# Patient Record
Sex: Male | Born: 1975 | Race: Black or African American | Hispanic: No | Marital: Married | Smoking: Current every day smoker
Health system: Southern US, Community
[De-identification: ages and names within clinical notes are randomized; demographics above are authoritative.]

## PROBLEM LIST (undated history)

## (undated) DIAGNOSIS — L089 Local infection of the skin and subcutaneous tissue, unspecified: Secondary | ICD-10-CM

## (undated) DIAGNOSIS — Z8614 Personal history of Methicillin resistant Staphylococcus aureus infection: Secondary | ICD-10-CM

## (undated) DIAGNOSIS — C4490 Unspecified malignant neoplasm of skin, unspecified: Secondary | ICD-10-CM

## (undated) DIAGNOSIS — M65062 Abscess of tendon sheath, left lower leg: Secondary | ICD-10-CM

## (undated) DIAGNOSIS — S80852A Superficial foreign body, left lower leg, initial encounter: Secondary | ICD-10-CM

## (undated) HISTORY — PX: HAND HARDWARE REMOVAL: SUR1125

## (undated) HISTORY — DX: Unspecified malignant neoplasm of skin, unspecified: C44.90

---

## 2003-07-29 ENCOUNTER — Emergency Department (HOSPITAL_COMMUNITY): Admission: EM | Admit: 2003-07-29 | Discharge: 2003-07-30 | Payer: Self-pay | Admitting: Emergency Medicine

## 2003-08-08 ENCOUNTER — Emergency Department (HOSPITAL_COMMUNITY): Admission: AD | Admit: 2003-08-08 | Discharge: 2003-08-08 | Payer: Self-pay | Admitting: Family Medicine

## 2004-09-01 ENCOUNTER — Emergency Department (HOSPITAL_COMMUNITY): Admission: EM | Admit: 2004-09-01 | Discharge: 2004-09-01 | Payer: Self-pay | Admitting: Emergency Medicine

## 2004-10-29 ENCOUNTER — Emergency Department (HOSPITAL_COMMUNITY): Admission: EM | Admit: 2004-10-29 | Discharge: 2004-10-29 | Payer: Self-pay | Admitting: Emergency Medicine

## 2005-07-16 ENCOUNTER — Emergency Department (HOSPITAL_COMMUNITY): Admission: EM | Admit: 2005-07-16 | Discharge: 2005-07-16 | Payer: Self-pay | Admitting: Emergency Medicine

## 2006-05-18 ENCOUNTER — Emergency Department (HOSPITAL_COMMUNITY): Admission: EM | Admit: 2006-05-18 | Discharge: 2006-05-18 | Payer: Self-pay | Admitting: Emergency Medicine

## 2007-08-27 ENCOUNTER — Emergency Department (HOSPITAL_COMMUNITY): Admission: EM | Admit: 2007-08-27 | Discharge: 2007-08-28 | Payer: Self-pay | Admitting: Emergency Medicine

## 2007-09-01 ENCOUNTER — Emergency Department (HOSPITAL_COMMUNITY): Admission: EM | Admit: 2007-09-01 | Discharge: 2007-09-01 | Payer: Self-pay | Admitting: Emergency Medicine

## 2008-05-19 ENCOUNTER — Emergency Department (HOSPITAL_COMMUNITY): Admission: EM | Admit: 2008-05-19 | Discharge: 2008-05-20 | Payer: Self-pay | Admitting: Emergency Medicine

## 2008-05-21 ENCOUNTER — Encounter (INDEPENDENT_AMBULATORY_CARE_PROVIDER_SITE_OTHER): Payer: Self-pay | Admitting: Emergency Medicine

## 2008-05-21 ENCOUNTER — Ambulatory Visit: Payer: Self-pay | Admitting: Surgery

## 2008-05-21 ENCOUNTER — Ambulatory Visit: Admission: RE | Admit: 2008-05-21 | Discharge: 2008-05-21 | Payer: Self-pay | Admitting: Emergency Medicine

## 2008-05-24 HISTORY — PX: OPEN REDUCTION INTERNAL FIXATION (ORIF) HAND: SHX5991

## 2009-02-25 ENCOUNTER — Emergency Department (HOSPITAL_COMMUNITY): Admission: EM | Admit: 2009-02-25 | Discharge: 2009-02-25 | Payer: Self-pay | Admitting: Emergency Medicine

## 2009-09-15 ENCOUNTER — Emergency Department (HOSPITAL_COMMUNITY): Admission: EM | Admit: 2009-09-15 | Discharge: 2009-09-15 | Payer: Self-pay | Admitting: Emergency Medicine

## 2009-09-15 ENCOUNTER — Emergency Department (HOSPITAL_COMMUNITY): Admission: EM | Admit: 2009-09-15 | Discharge: 2009-09-15 | Payer: Self-pay | Admitting: Family Medicine

## 2010-06-29 ENCOUNTER — Inpatient Hospital Stay (INDEPENDENT_AMBULATORY_CARE_PROVIDER_SITE_OTHER)
Admission: RE | Admit: 2010-06-29 | Discharge: 2010-06-29 | Disposition: A | Discharge status: Home / Self Care | Payer: BLUE CROSS/BLUE SHIELD | Source: Ambulatory Visit | Attending: Family Medicine | Admitting: Family Medicine

## 2010-06-29 DIAGNOSIS — H01009 Unspecified blepharitis unspecified eye, unspecified eyelid: Secondary | ICD-10-CM

## 2010-08-11 LAB — POCT I-STAT, CHEM 8
BUN: 11 mg/dL (ref 6–23)
Calcium, Ion: 1.18 mmol/L (ref 1.12–1.32)
Chloride: 105 mEq/L (ref 96–112)
Creatinine, Ser: 0.9 mg/dL (ref 0.4–1.5)
Glucose, Bld: 100 mg/dL — ABNORMAL HIGH (ref 70–99)

## 2010-08-27 LAB — DIFFERENTIAL
Basophils Absolute: 0.1 10*3/uL (ref 0.0–0.1)
Basophils Relative: 1 % (ref 0–1)
Eosinophils Absolute: 0.4 10*3/uL (ref 0.0–0.7)
Monocytes Absolute: 0.4 10*3/uL (ref 0.1–1.0)
Monocytes Relative: 6 % (ref 3–12)
Neutrophils Relative %: 50 % (ref 43–77)

## 2010-08-27 LAB — CBC
MCHC: 33.5 g/dL (ref 30.0–36.0)
MCV: 87.8 fL (ref 78.0–100.0)
RBC: 4.85 MIL/uL (ref 4.22–5.81)
RDW: 13 % (ref 11.5–15.5)

## 2010-08-27 LAB — BASIC METABOLIC PANEL
CO2: 25 mEq/L (ref 19–32)
Chloride: 106 mEq/L (ref 96–112)
Creatinine, Ser: 1.08 mg/dL (ref 0.4–1.5)
GFR calc Af Amer: >60 mL/min (ref >60)
Glucose, Bld: 100 mg/dL — ABNORMAL HIGH (ref 70–99)

## 2010-08-27 LAB — POCT CARDIAC MARKERS
Myoglobin, poc: 54.3 ng/mL (ref 12–200)
Troponin i, poc: <0.05 ng/mL (ref 0.00–0.09)

## 2011-02-16 LAB — CBC
RBC: 4.41
WBC: 11.9 — ABNORMAL HIGH

## 2011-02-16 LAB — BASIC METABOLIC PANEL
Calcium: 9.2
Creatinine, Ser: 1.08
GFR calc Af Amer: >60

## 2011-02-16 LAB — DIFFERENTIAL
Basophils Relative: 0
Lymphocytes Relative: 15
Lymphs Abs: 1.7
Monocytes Relative: 5
Neutro Abs: 9.4 — ABNORMAL HIGH
Neutrophils Relative %: 79 — ABNORMAL HIGH

## 2011-07-07 ENCOUNTER — Encounter (HOSPITAL_COMMUNITY): Payer: Self-pay | Admitting: *Deleted

## 2011-07-07 ENCOUNTER — Emergency Department (HOSPITAL_COMMUNITY)
Admission: EM | Admit: 2011-07-07 | Discharge: 2011-07-08 | Disposition: A | Discharge status: Home / Self Care | Payer: BC Managed Care – PPO | Attending: Emergency Medicine | Admitting: Emergency Medicine

## 2011-07-07 DIAGNOSIS — R6883 Chills (without fever): Secondary | ICD-10-CM | POA: Insufficient documentation

## 2011-07-07 DIAGNOSIS — J029 Acute pharyngitis, unspecified: Secondary | ICD-10-CM | POA: Insufficient documentation

## 2011-07-07 DIAGNOSIS — F17200 Nicotine dependence, unspecified, uncomplicated: Secondary | ICD-10-CM | POA: Insufficient documentation

## 2011-07-07 DIAGNOSIS — R220 Localized swelling, mass and lump, head: Secondary | ICD-10-CM | POA: Insufficient documentation

## 2011-07-07 DIAGNOSIS — R599 Enlarged lymph nodes, unspecified: Secondary | ICD-10-CM | POA: Insufficient documentation

## 2011-07-07 NOTE — ED Notes (Signed)
sorethroat since this am

## 2011-07-08 MED ORDER — PENICILLIN G BENZATHINE 1200000 UNIT/2ML IM SUSP
1.2000 10*6.[IU] | INTRAMUSCULAR | Status: AC
  Administered 2011-07-08: 1.2 10*6.[IU] via INTRAMUSCULAR
  Filled 2011-07-08: qty 2

## 2011-07-08 MED ORDER — HYDROCODONE-ACETAMINOPHEN 7.5-500 MG/15ML PO SOLN
10.0000 mL | Freq: Once | ORAL | Status: AC
  Administered 2011-07-08: 10 mL via ORAL
  Filled 2011-07-08: qty 15

## 2011-07-08 MED ORDER — LIDOCAINE VISCOUS 2 % MT SOLN
10.0000 mL | Freq: Once | OROMUCOSAL | Status: AC
  Administered 2011-07-08: 10 mL via OROMUCOSAL
  Filled 2011-07-08: qty 15

## 2011-07-08 MED ORDER — DEXAMETHASONE SODIUM PHOSPHATE 10 MG/ML IJ SOLN
10.0000 mg | Freq: Once | INTRAMUSCULAR | Status: AC
  Administered 2011-07-08: 10 mg via INTRAMUSCULAR
  Filled 2011-07-08: qty 1

## 2011-07-08 MED ORDER — HYDROCODONE-ACETAMINOPHEN 7.5-325 MG/15ML PO SOLN: 15.0000 mL | Freq: Four times a day (QID) | ORAL | Status: AC | PRN

## 2011-07-08 NOTE — Discharge Instructions (Signed)
Please review the instructions below. Do salt water gargles several times a day per the instructions below. He may also find that over-the-counter Chloraseptic Spray as helpful as well for you for her pain. Use liquid medication for pain as needed over the next 1 to 2 days. Rest, drink liquids often and follow up with a primary care physician (PCP) as soon as can be arranged. We have provided a "[facility]" number above, the resource list below and the attached referral to assist you in getting established with a PCP. Return to the emergency department if your symptoms worsen.   Salt Water Gargle  This solution will help make your mouth and throat feel better. HOME CARE INSTRUCTIONS   Mix 1 teaspoon of salt in 8 ounces of warm water.   Gargle with this solution as much or often as you need or as directed. Swish and gargle gently if you have any sores or wounds in your mouth.   Do not swallow this mixture.  Document Released: 02/12/2004 Document Revised: 01/20/2011 Document Reviewed: 07/05/2008 [facility] Patient Information 2012 [place], [place].  Viral and Bacterial Pharyngitis  Pharyngitis is a sore throat. It is an infection of the back of the throat (pharynx). HOME CARE   Only take medicine as told by your doctor. You may get sick again if you do not take medicine as told.   Drink enough fluids to keep your pee (urine) clear or pale yellow.   Rest.   Rinse your mouth (gargle) with salt water ( teaspoon of salt in 8 ounces of water) every 1 to 2 hours. This will help the pain.   For children over the age of 7, suck on hard candy or sore throat lozenges.  GET HELP RIGHT AWAY IF:   There are large, tender lumps in your neck.   You have a rash.   You cough up green, yellow-brown, or bloody mucus.   You have a stiff neck.   There is redness, puffiness (swelling), or very bad pain anywhere on the neck.   You drool or are unable to swallow liquids.   You throw up (vomit) or  are not able to keep medicine or liquids down.   You have very bad pain that will not stop with medicine.   You have problems breathing (not from a stuffy nose).   You cannot open your mouth completely.   You or your child has a temperature by mouth above 102 F (38.9 C), not controlled by medicine.   Your baby is older than 3 months with a rectal temperature of 102 F (38.9 C) or higher.   Your baby is 48 months old or younger with a rectal temperature of 100.4 F (38 C) or higher.  MAKE SURE YOU:   Understand these instructions.   Will watch this condition.   Will get help right away if you or your child is not doing well or gets worse.  Document Released: 10/27/2007 Document Revised: 01/20/2011 Document Reviewed: 06/09/2009 [facility] Patient Information 2012 [place], [place].  RESOURCE GUIDE  Dental Problems  Patients with Medicaid: [facility] [id number] [address]  [address] Phone:  [phone]                                                  Phone:  [phone]  If unable to pay or uninsured, contact:  [facility] or [facility]. to become qualified for the adult dental clinic.  Chronic Pain Problems Contact [facility]  [phone] Patients need to be referred by their primary care doctor.  Insufficient Money for Medicine Contact [employer]:  call "211" or [facility] 340-076-0890.  No Primary Care Doctor Call [facility]  [phone] Other agencies that provide inexpensive medical care    [facility]  5021272032    [facility]  [phone]    [facility]  [phone]    [facility]  [phone]    [facility]  [phone]    [facility]  [phone]  Psychological Services [facility]  [phone] [facility] Services  [phone] [facility] Mental Health   [phone]  (emergency services ([phone])  Substance Abuse Resources [facility]  6840634950 [facility] (979)[phone] [facility] [place] 979-648-0530 Floydene Flock [phone] Residential & Outpatient Substance Abuse Program  [phone]  Abuse/Neglect [facility] Child Abuse Hotline 774-668-4255 [facility] Child Abuse Hotline [phone] (After Hours)  Emergency Shelter [facility] 2071633384  Maternity Homes [facility] [place] [facility] [phone] [facility] [phone]  MRSA Hotline #:   4841012503    [facility] Resources  [facility] [place]     [employer]                          [facility] Dept. [address] [place]                       [address]      [address]  Blondell Reveal Phone:  [phone]                                   Phone:  [phone]                 Phone:  [phone]  [facility] Phone:  [phone]  [facility] Child Abuse Hotline 9860901530 [phone] (After Hours)

## 2011-07-08 NOTE — ED Provider Notes (Signed)
Medical screening examination/treatment/procedure(s) were performed by non-physician practitioner and as supervising physician I was immediately available for consultation/collaboration.   Lyanne Co, MD 07/08/11 [phone]

## 2011-07-08 NOTE — ED Notes (Signed)
Pt c/o sore throat onset this am.  Also c/o right side of neck painful with swelling.

## 2011-07-08 NOTE — ED Provider Notes (Signed)
History     [name]: [id number]  Arrival date & time 07/07/11  2325   First MD Initiated Contact with Patient 07/08/11 0112      Chief Complaint  Patient presents with  . Sore Throat     Patient is a 36 y.o. male presenting with pharyngitis. The history is provided by the patient.  Sore Throat This is a new problem. The current episode started today. The problem occurs constantly. The problem has been gradually worsening. Associated symptoms include chills, a sore throat and swollen glands. Pertinent negatives include no coughing, fever, nausea, rash, urinary symptoms or vomiting.  Patient reports onset of sore throat yesterday morning that gradually worsened throughout the day. States he has felt as if he had a fever but unknown. Denies cough or other associated symptoms.  History reviewed. No pertinent past medical history.  History reviewed. No pertinent past surgical history.  No family history on file.  History  Substance Use Topics  . Smoking status: Current Everyday Smoker  . Smokeless tobacco: Not on file  . Alcohol Use: Yes      Review of Systems  Constitutional: Positive for chills. Negative for fever.  HENT: Positive for sore throat.   Eyes: Negative.   Respiratory: Negative.  Negative for cough.   Cardiovascular: Negative.   Gastrointestinal: Negative.  Negative for nausea and vomiting.  Genitourinary: Negative.   Musculoskeletal: Negative.   Skin: Negative.  Negative for rash.  Neurological: Negative.   Hematological: Negative.   Psychiatric/Behavioral: Negative.     Allergies  Review of patient's allergies indicates no known allergies.  Home Medications  No current outpatient prescriptions on file.  BP 127/80  Pulse 88  Temp(Src) 99 F (37.2 C) (Oral)  Resp 18  SpO2 96%  Physical Exam  Constitutional: He appears well-developed and well-nourished.  HENT:  Head: Normocephalic and atraumatic.  Right Ear: Tympanic membrane, external ear and ear  canal normal.  Left Ear: Tympanic membrane, external ear and ear canal normal.  Nose: Mucosal edema present. No sinus tenderness. Right sinus exhibits no maxillary sinus tenderness and no frontal sinus tenderness. Left sinus exhibits no maxillary sinus tenderness and no frontal sinus tenderness.  Mouth/Throat: Uvula is midline and mucous membranes are normal. Oropharyngeal exudate and posterior oropharyngeal erythema present. No tonsillar abscesses.       Grossly erythematous, enlarged tonsils bil (2-3+) with exudate noted.  Eyes: Conjunctivae are normal.  Neck: Neck supple.  Cardiovascular: Normal rate and regular rhythm.   Pulmonary/Chest: Effort normal and breath sounds normal.  Abdominal: Soft. Bowel sounds are normal.  Musculoskeletal: Normal range of motion.  Lymphadenopathy:    He has cervical adenopathy.       Right cervical: Superficial cervical adenopathy present.  Neurological: He is alert.  Skin: Skin is warm and dry.  Psychiatric: He has a normal mood and affect.    ED Course  Procedures  Findings and clinical impression discussed with patient. Will plan for discharge home with short course of liquid pain medicine, encourage rest and fluids for 24 hours and follow up with primary care physician if not improving. Patient agreeable with plan.    Labs Reviewed  RAPID STREP SCREEN   No results found.   No diagnosis found.    MDM  HPI/PE and clinical findings c/w 1. Acute pharyngitis (exudative)        Leanne Chang, NP 07/08/11 [phone]

## 2011-09-27 ENCOUNTER — Emergency Department (INDEPENDENT_AMBULATORY_CARE_PROVIDER_SITE_OTHER): Payer: BC Managed Care – PPO

## 2011-09-27 ENCOUNTER — Encounter (HOSPITAL_COMMUNITY): Payer: Self-pay | Admitting: *Deleted

## 2011-09-27 ENCOUNTER — Emergency Department (INDEPENDENT_AMBULATORY_CARE_PROVIDER_SITE_OTHER)
Admission: EM | Admit: 2011-09-27 | Discharge: 2011-09-27 | Disposition: A | Discharge status: Home / Self Care | Payer: BC Managed Care – PPO | Source: Home / Self Care | Attending: Emergency Medicine | Admitting: Emergency Medicine

## 2011-09-27 DIAGNOSIS — W3400XA Accidental discharge from unspecified firearms or gun, initial encounter: Secondary | ICD-10-CM

## 2011-09-27 DIAGNOSIS — M7989 Other specified soft tissue disorders: Secondary | ICD-10-CM

## 2011-09-27 DIAGNOSIS — S91009A Unspecified open wound, unspecified ankle, initial encounter: Secondary | ICD-10-CM

## 2011-09-27 DIAGNOSIS — S81839A Puncture wound without foreign body, unspecified lower leg, initial encounter: Secondary | ICD-10-CM

## 2011-09-27 MED ORDER — IBUPROFEN 600 MG PO TABS: 600.0000 mg | ORAL_TABLET | Freq: Four times a day (QID) | ORAL | Status: AC | PRN
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
## 2011-12-06 ENCOUNTER — Emergency Department (HOSPITAL_COMMUNITY)
Admission: EM | Admit: 2011-12-06 | Discharge: 2011-12-06 | Disposition: A | Discharge status: Home Health | Payer: BC Managed Care – PPO | Attending: Emergency Medicine | Admitting: Emergency Medicine

## 2011-12-06 ENCOUNTER — Encounter (HOSPITAL_COMMUNITY): Payer: Self-pay | Admitting: *Deleted

## 2011-12-06 DIAGNOSIS — IMO0002 Reserved for concepts with insufficient information to code with codable children: Secondary | ICD-10-CM

## 2011-12-06 DIAGNOSIS — L03119 Cellulitis of unspecified part of limb: Secondary | ICD-10-CM | POA: Insufficient documentation

## 2011-12-06 DIAGNOSIS — L02419 Cutaneous abscess of limb, unspecified: Secondary | ICD-10-CM | POA: Insufficient documentation

## 2011-12-06 DIAGNOSIS — F17200 Nicotine dependence, unspecified, uncomplicated: Secondary | ICD-10-CM | POA: Insufficient documentation

## 2011-12-06 MED ORDER — SULFAMETHOXAZOLE-TRIMETHOPRIM 800-160 MG PO TABS: 1.0000 | ORAL_TABLET | Freq: Two times a day (BID) | ORAL | Status: AC

## 2011-12-06 MED ORDER — HYDROCODONE-ACETAMINOPHEN 5-325 MG PO TABS: 1.0000 | ORAL_TABLET | ORAL | Status: AC | PRN
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
## 2011-12-06 NOTE — ED Notes (Signed)
Patient with left leg swelling since Friday.  Patient had an abcess that he squeezed today.

## 2011-12-08 NOTE — ED Provider Notes (Signed)
Medical screening examination/treatment/procedure(s) were performed by non-physician practitioner and as supervising physician I was immediately available for consultation/collaboration.  Gerhard Munch, MD 12/08/11 [phone]

## 2012-07-24 ENCOUNTER — Emergency Department (INDEPENDENT_AMBULATORY_CARE_PROVIDER_SITE_OTHER)
Admission: EM | Admit: 2012-07-24 | Discharge: 2012-07-24 | Disposition: A | Discharge status: Home / Self Care | Payer: BC Managed Care – PPO | Source: Home / Self Care

## 2012-07-24 ENCOUNTER — Encounter (HOSPITAL_COMMUNITY): Payer: Self-pay | Admitting: Emergency Medicine

## 2012-07-24 DIAGNOSIS — J111 Influenza due to unidentified influenza virus with other respiratory manifestations: Secondary | ICD-10-CM

## 2012-07-24 MED ORDER — ACETAMINOPHEN 325 MG PO TABS
650.0000 mg | ORAL_TABLET | Freq: Once | ORAL | Status: AC
  Administered 2012-07-24: 650 mg via ORAL

## 2012-07-24 MED ORDER — ACETAMINOPHEN 325 MG PO TABS
ORAL_TABLET | ORAL | Status: AC
  Filled 2012-07-24: qty 2

## 2012-07-24 NOTE — ED Notes (Signed)
Pt is here for cold/flu like sx since this am Sx include: fever, headaches, chills, vomiting (last episode 0900), nauseas, diarrhea, cough w/clear mucous, loss of appetite Last had food yest night; had a little bit of water today Has not had any meds for discomfort BP is 96/58 manual  He is alert w/no signs of acute distress
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
## 2012-07-25 NOTE — ED Provider Notes (Signed)
Medical screening examination/treatment/procedure(s) were performed by resident physician or non-physician practitioner and as supervising physician I was immediately available for consultation/collaboration.   KINDL,JAMES DOUGLAS MD.   James D Kindl, MD 07/25/12 1614 

## 2014-01-20 ENCOUNTER — Emergency Department (HOSPITAL_COMMUNITY)
Admission: EM | Admit: 2014-01-20 | Discharge: 2014-01-20 | Disposition: A | Discharge status: Home / Self Care | Payer: BC Managed Care – PPO | Attending: Emergency Medicine | Admitting: Emergency Medicine

## 2014-01-20 ENCOUNTER — Encounter (HOSPITAL_COMMUNITY): Payer: Self-pay | Admitting: Emergency Medicine

## 2014-01-20 DIAGNOSIS — L03115 Cellulitis of right lower limb: Secondary | ICD-10-CM

## 2014-01-20 DIAGNOSIS — Z87828 Personal history of other (healed) physical injury and trauma: Secondary | ICD-10-CM | POA: Diagnosis not present

## 2014-01-20 DIAGNOSIS — B353 Tinea pedis: Secondary | ICD-10-CM | POA: Diagnosis not present

## 2014-01-20 DIAGNOSIS — F17200 Nicotine dependence, unspecified, uncomplicated: Secondary | ICD-10-CM | POA: Insufficient documentation

## 2014-01-20 DIAGNOSIS — L03119 Cellulitis of unspecified part of limb: Secondary | ICD-10-CM

## 2014-01-20 DIAGNOSIS — L02419 Cutaneous abscess of limb, unspecified: Secondary | ICD-10-CM | POA: Insufficient documentation

## 2014-01-20 DIAGNOSIS — M79609 Pain in unspecified limb: Secondary | ICD-10-CM | POA: Diagnosis present

## 2014-01-20 MED ORDER — DOXYCYCLINE HYCLATE 100 MG PO CAPS: 100.0000 mg | ORAL_CAPSULE | Freq: Two times a day (BID) | ORAL | Status: DC

## 2014-01-20 MED ORDER — NAPROXEN 500 MG PO TABS: 500.0000 mg | ORAL_TABLET | Freq: Two times a day (BID) | ORAL | Status: DC | PRN

## 2014-01-20 MED ORDER — HYDROCODONE-ACETAMINOPHEN 5-325 MG PO TABS: 1.0000 | ORAL_TABLET | Freq: Four times a day (QID) | ORAL | Status: DC | PRN

## 2014-01-20 MED ORDER — NYSTATIN 100000 UNIT/GM EX POWD: CUTANEOUS | Status: DC
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
## 2014-01-20 NOTE — Discharge Instructions (Signed)
Keep feet clean and dry, using nystatin powder 4 times daily and changing your socks with each powder application. After showers, use a hair dryer on cool setting to dry in between toes. Take antibiotic until it is finished. Take naprosyn and norco as directed, as needed for pain but do not drive or operate machinery with norco use. Followup with [facility]Primary Care doctor in 4 days for wound recheck.  Return to emergency department for emergent changing or worsening symptoms.   Athlete's Foot  Athlete's foot is a skin infection caused by a fungus. Athlete's foot is often seen between or under the toes. It can also be seen on the bottom of the foot. Athlete's foot can spread to other people by sharing towels or shower stalls. HOME CARE  Only take medicines as told by your doctor. Do not use steroid creams.  Wash your feet daily. Dry your feet well, especially between the toes.  Change your socks every day. Wear cotton or wool socks.  Change your socks 2 to 3 times a day in hot weather.  Wear sandals or canvas tennis shoes with good airflow.  If you have blisters, soak your feet in a solution as told by your doctor. Do this for 20 to 30 minutes, 2 times a day. Dry your feet well after you soak them.  Do not share towels.  Wear sandals when you use shared locker rooms or showers. GET HELP RIGHT AWAY IF:   You have a fever.  Your foot is puffy (swollen), sore, warm, or red.  You are not getting better after 7 days of treatment.  You still have athlete's foot after 30 days.  You have problems caused by your medicine. MAKE SURE YOU:   Understand these instructions.  Will watch your condition.  Will get help right away if you are not doing well or get worse. Document Released: 10/27/2007 Document Revised: 08/02/2011 Document Reviewed: 02/26/2011 [facility] Patient Information 2015 [place], [place]. This information is not intended to replace advice given to you by your  health care provider. Make sure you discuss any questions you have with your health care provider.  Cellulitis Cellulitis is an infection of the skin and the tissue under the skin. The infected area is usually red and tender. This happens most often in the arms and lower legs. HOME CARE   Take your antibiotic medicine as told. Finish the medicine even if you start to feel better.  Keep the infected arm or leg raised (elevated).  Put a warm cloth on the area up to 4 times per day.  Only take medicines as told by your doctor.  Keep all doctor visits as told. GET HELP IF:  You see red streaks on the skin coming from the infected area.  Your red area gets bigger or turns a dark color.  Your bone or joint under the infected area is painful after the skin heals.  Your infection comes back in the same area or different area.  You have a puffy (swollen) bump in the infected area.  You have new symptoms.  You have a fever. GET HELP RIGHT AWAY IF:   You feel very sleepy.  You throw up (vomit) or have watery poop (diarrhea).  You feel sick and have muscle aches and pains. MAKE SURE YOU:   Understand these instructions.  Will watch your condition.  Will get help right away if you are not doing well or get worse. Document Released: 10/27/2007 Document Revised:  09/24/2013 Document Reviewed: 07/26/2011 [employer] Patient Information 2015 [place], [place]. This information is not intended to replace advice given to you by your health care provider. Make sure you discuss any questions you have with your health care provider.  Emergency Department Resource Guide 1) Find a Doctor and Pay Out of Pocket Although you won't have to find out who is covered by your insurance plan, it is a good idea to ask around and get recommendations. You will then need to call the office and see if the doctor you have chosen will accept you as a new patient and what types of options they offer for patients  who are self-pay. Some doctors offer discounts or will set up payment plans for their patients who do not have insurance, but you will need to ask so you aren't surprised when you get to your appointment.  2) Contact Your Local Health Department Not all health departments have doctors that can see patients for sick visits, but many do, so it is worth a call to see if yours does. If you don't know where your local health department is, you can check in your phone book. The CDC also has a tool to help you locate your state's health department, and many state websites also have listings of all of their local health departments.  3) Find a Walk-in Clinic If your illness is not likely to be very severe or complicated, you may want to try a walk in clinic. These are popping up all over the country in pharmacies, drugstores, and shopping centers. They're usually staffed by nurse practitioners or physician assistants that have been trained to treat common illnesses and complaints. They're usually fairly quick and inexpensive. However, if you have serious medical issues or chronic medical problems, these are probably not your best option.  No Primary Care Doctor: - Call [facility] at  [phone] - they can help you locate a primary care doctor that  accepts your insurance, provides certain services, etc. - Physician Referral Service- [phone]  Chronic Pain Problems: Organization         Address  Phone   Notes  [facility]  [phone] Patients need to be referred by their primary care doctor.   Medication Assistance: Organization         Address  Phone   Notes  [facility] Medication [facility] [address] [place] [place], [place] [address] [phone] --Must be a resident of [facility] -- Must have NO insurance coverage whatsoever (no Medicaid/ Medicare, etc.) -- The pt. MUST have a primary care doctor that directs their care regularly and follows  them in the community   [facility]  [phone]   [employer]  [phone]    Agencies that provide inexpensive medical care: Organization         Address  Phone   Notes  [facility]  [phone]   [facility]    [phone]   [facility] [address] [place], [place] [address] [phone]   [facility] [address] [place] [phone]   [facility]    [phone]   [facility]    [phone]   [facility]  [address], [place] Phone:  [phone], Fax:  [phone] Hours of Operation:  9 am - 6 pm, M-F.  Also accepts Medicaid/Medicare and self-pay.  [facility]  [address], [place] Phone: [phone], Fax: [phone]. Hours of Operation:  8:30 am - 5:30 pm, M-F.  Also accepts Medicaid and self-pay.  [facility] [place] [address], [place] Phone: [phone]   [facility] [address] Natasha Bence [place], [place] [phone], Ext. [phone] Mondays & Thursdays: 7-9 AM.  First 15 patients are seen on a first come, first serve basis.    Medicaid-accepting [facility] Providers:  Organization         Address  Phone   Notes  [facility] [address], [place] [phone] Also accepts self-pay patients.  [facility] [address] Laurell Josephs [place], [place]  [phone]   [facility] [address], [place] [phone]   [facility] [address] [place] [phone]   Renaye Rakers [address], [place]   [phone] Only accepts [place] patients after they have their name applied to their card.   Self-Pay (no insurance) in [facility]:  Organization         Address  Phone   Notes  Sickle Cell  Patients, [facility] [address] [place], [place] [phone]   [facility] [address] [place], [place] [phone]   [facility]  [address], [place] [phone]   [facility]/Dr. Osei-Bonsu  [address], [place] or [address], [place] [phone] Phone number for both [place] and [place] locations is the same.  [facility] [address] [place] [phone]   [facility] [address], [place] or [address] [phone]   [facility] [address], [place] [phone], phone; [phone], fax Sees patients 1st and 3rd Saturday of every month.  Must not qualify for public or private insurance (i.e. Medicaid, Medicare, [place] Choice, Veterans' Benefits)  Household income should be no more than 200% of the poverty level The clinic cannot treat you if you are pregnant or think you are pregnant  Sexually transmitted diseases are not treated at the clinic.

## 2014-01-20 NOTE — ED Provider Notes (Signed)
Medical screening examination/treatment/procedure(s) were performed by non-physician practitioner and as supervising physician I was immediately available for consultation/collaboration.   EKG Interpretation None        Gilda Crease, MD 01/20/14 [phone]

## 2014-01-20 NOTE — ED Notes (Signed)
Patient arrives with complaint of right foot pain. States that it started Friday and it his foot is swollen. Comparison of the feet reveals mild swelling of right foot. Tenderness noted with palpation. Denies injury, fever, rash, and is able to ambulate. Denies history of similar problem and diabetes. No obvious lesions or wounds.

## 2014-01-23 ENCOUNTER — Emergency Department (HOSPITAL_COMMUNITY): Payer: BC Managed Care – PPO

## 2014-01-23 ENCOUNTER — Encounter (HOSPITAL_COMMUNITY): Payer: Self-pay | Admitting: Emergency Medicine

## 2014-01-23 ENCOUNTER — Emergency Department (HOSPITAL_COMMUNITY)
Admission: EM | Admit: 2014-01-23 | Discharge: 2014-01-23 | Disposition: A | Discharge status: Home / Self Care | Payer: BC Managed Care – PPO | Attending: Emergency Medicine | Admitting: Emergency Medicine

## 2014-01-23 DIAGNOSIS — L089 Local infection of the skin and subcutaneous tissue, unspecified: Secondary | ICD-10-CM | POA: Diagnosis present

## 2014-01-23 DIAGNOSIS — L03119 Cellulitis of unspecified part of limb: Secondary | ICD-10-CM | POA: Diagnosis not present

## 2014-01-23 DIAGNOSIS — L02619 Cutaneous abscess of unspecified foot: Secondary | ICD-10-CM | POA: Insufficient documentation

## 2014-01-23 DIAGNOSIS — F17200 Nicotine dependence, unspecified, uncomplicated: Secondary | ICD-10-CM | POA: Diagnosis not present

## 2014-01-23 DIAGNOSIS — Z87828 Personal history of other (healed) physical injury and trauma: Secondary | ICD-10-CM | POA: Diagnosis not present

## 2014-01-23 DIAGNOSIS — L03115 Cellulitis of right lower limb: Secondary | ICD-10-CM

## 2014-01-23 LAB — CBC WITH DIFFERENTIAL/PLATELET
Basophils Absolute: 0 10*3/uL (ref 0.0–0.1)
Basophils Relative: 0 % (ref 0–1)
Eosinophils Absolute: 0.4 10*3/uL (ref 0.0–0.7)
Eosinophils Relative: 4 % (ref 0–5)
HCT: 41.3 % (ref 39.0–52.0)
Hemoglobin: 14.3 g/dL (ref 13.0–17.0)
Lymphocytes Relative: 30 % (ref 12–46)
Lymphs Abs: 3.3 10*3/uL (ref 0.7–4.0)
MCH: 29.8 pg (ref 26.0–34.0)
MCHC: 34.6 g/dL (ref 30.0–36.0)
MCV: 86 fL (ref 78.0–100.0)
Monocytes Absolute: 0.6 10*3/uL (ref 0.1–1.0)
Monocytes Relative: 6 % (ref 3–12)
Neutro Abs: 6.6 10*3/uL (ref 1.7–7.7)
Neutrophils Relative %: 60 % (ref 43–77)
Platelets: 255 10*3/uL (ref 150–400)
RBC: 4.8 MIL/uL (ref 4.22–5.81)
RDW: 13 % (ref 11.5–15.5)
WBC: 10.9 10*3/uL — ABNORMAL HIGH (ref 4.0–10.5)

## 2014-01-23 LAB — SEDIMENTATION RATE: Sed Rate: 3 mm/hr (ref 0–16)

## 2014-01-23 LAB — I-STAT CHEM 8, ED
BUN: 14 mg/dL (ref 6–23)
Calcium, Ion: 1.21 mmol/L (ref 1.12–1.23)
Chloride: 105 mEq/L (ref 96–112)
Creatinine, Ser: 1.2 mg/dL (ref 0.50–1.35)
Glucose, Bld: 115 mg/dL — ABNORMAL HIGH (ref 70–99)
HCT: 48 % (ref 39.0–52.0)
Hemoglobin: 16.3 g/dL (ref 13.0–17.0)
Potassium: 4.1 mEq/L (ref 3.7–5.3)
Sodium: 141 mEq/L (ref 137–147)
TCO2: 27 mmol/L (ref 0–100)

## 2014-01-23 MED ORDER — CEPHALEXIN 500 MG PO CAPS: 500.0000 mg | ORAL_CAPSULE | Freq: Four times a day (QID) | ORAL | Status: AC

## 2014-01-23 MED ORDER — MORPHINE SULFATE 4 MG/ML IJ SOLN
4.0000 mg | Freq: Once | INTRAMUSCULAR | Status: AC
  Administered 2014-01-23: 4 mg via INTRAVENOUS
  Filled 2014-01-23: qty 1

## 2014-01-23 MED ORDER — HYDROCODONE-ACETAMINOPHEN 5-325 MG PO TABS: 2.0000 | ORAL_TABLET | ORAL | Status: DC | PRN

## 2014-01-23 MED ORDER — VANCOMYCIN HCL 10 G IV SOLR
1500.0000 mg | Freq: Once | INTRAVENOUS | Status: AC
  Administered 2014-01-23: 1500 mg via INTRAVENOUS
  Filled 2014-01-23: qty 1500

## 2014-01-23 NOTE — Discharge Instructions (Signed)
Continue taking the Doxycycline and complete this as prescribed. Take the Keflex as prescribed. Return to the ED on Friday between 3 pm and 11pm to have the infection re checked. Ask to be bedded in the pod B where Dr. Malen Gauze is working. If you develop fever, chills, sweats, worsening pain or any other alarming or concerning issues, return to the ED.

## 2014-01-23 NOTE — ED Provider Notes (Signed)
38 year old male with a history of a right fourth toe swelling and pain, on doxycycline for the last 3 days, no improvement. He is gradually worsened, on exam he has redness swelling and tenderness to the right fourth toe, associated drainage between the third and fourth toe, tenderness extends onto the distal foot, no obvious asymmetry of the feet, normal pulses, no tachycardia, no fever. Imaging of the foot to rule out osteomyelitis, start vancomycin here, sent home with cephalosporin in addition to doxycycline. Can follow up with family doctor.  xdray without acute findings, VS without high risk findings, tolerating oral, add keflex.  I saw and evaluated the patient, reviewed the resident's note and I agree with the findings and plan.    Vida Roller, MD 01/24/14 [phone]

## 2014-01-23 NOTE — ED Notes (Signed)
Patient transported to X-ray 
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
## 2014-01-23 NOTE — ED Notes (Signed)
Pt in for follow up on wound infection to his right foot, states the pain and swelling continues, seen for same last week, denies fever at home

## 2014-01-24 LAB — C-REACTIVE PROTEIN: CRP: 0.9 mg/dL — AB (ref <0.60)

## 2014-01-24 NOTE — ED Provider Notes (Signed)
I saw and evaluated the patient, reviewed the resident's note and I agree with the findings and plan.  Please see my separate note regarding my evaluation of the patient.  Clinical Impression:  Infection of toe  Vida Roller, MD 01/24/14 1427

## 2014-04-25 ENCOUNTER — Encounter (HOSPITAL_COMMUNITY): Payer: Self-pay | Admitting: Emergency Medicine

## 2014-04-25 ENCOUNTER — Emergency Department (HOSPITAL_COMMUNITY)
Admission: EM | Admit: 2014-04-25 | Discharge: 2014-04-25 | Disposition: A | Discharge status: Home / Self Care | Payer: BC Managed Care – PPO | Attending: Emergency Medicine | Admitting: Emergency Medicine

## 2014-04-25 DIAGNOSIS — Z720 Tobacco use: Secondary | ICD-10-CM | POA: Diagnosis not present

## 2014-04-25 DIAGNOSIS — J020 Streptococcal pharyngitis: Secondary | ICD-10-CM | POA: Diagnosis not present

## 2014-04-25 DIAGNOSIS — L729 Follicular cyst of the skin and subcutaneous tissue, unspecified: Secondary | ICD-10-CM | POA: Diagnosis present

## 2014-04-25 DIAGNOSIS — Z87828 Personal history of other (healed) physical injury and trauma: Secondary | ICD-10-CM | POA: Insufficient documentation

## 2014-04-25 DIAGNOSIS — N492 Inflammatory disorders of scrotum: Secondary | ICD-10-CM | POA: Diagnosis not present

## 2014-04-25 LAB — RAPID STREP SCREEN (MED CTR MEBANE ONLY): Streptococcus, Group A Screen (Direct): POSITIVE — AB

## 2014-04-25 MED ORDER — ACETAMINOPHEN-CODEINE 120-12 MG/5ML PO SOLN: 10.0000 mL | ORAL | Status: DC | PRN

## 2014-04-25 MED ORDER — CLINDAMYCIN HCL 150 MG PO CAPS: 450.0000 mg | ORAL_CAPSULE | Freq: Three times a day (TID) | ORAL | Status: DC

## 2014-04-25 NOTE — ED Notes (Signed)
Pt reports abscess on scrotum and popped it. Upon assessment with PA site intact with drainage at opening. Abscess localized; no cellulitis noted per PA. Pt reports sore throat starting three days ago.

## 2014-04-25 NOTE — ED Notes (Signed)
Pt c/o abscess on scrotum since Monday. Pt sts he "popped" it yesterday and it has gotten larger and more painful since then. Pt denies odor. Pt c/o some pus discharge. Pt A&Ox4. Ambulatory to triage.
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
## 2014-04-25 NOTE — Discharge Instructions (Signed)
Return here as needed. Follow up with the urologist provided. Increase your fluid intake. Use warm soaks and compresses on your scrotum

## 2014-05-24 DIAGNOSIS — Z8614 Personal history of Methicillin resistant Staphylococcus aureus infection: Secondary | ICD-10-CM

## 2014-05-24 HISTORY — DX: Personal history of Methicillin resistant Staphylococcus aureus infection: Z86.14

## 2014-07-29 ENCOUNTER — Emergency Department (HOSPITAL_COMMUNITY)
Admission: EM | Admit: 2014-07-29 | Discharge: 2014-07-30 | Disposition: A | Discharge status: Home / Self Care | Payer: BLUE CROSS/BLUE SHIELD | Attending: Emergency Medicine | Admitting: Emergency Medicine

## 2014-07-29 ENCOUNTER — Encounter (HOSPITAL_COMMUNITY): Payer: Self-pay | Admitting: Emergency Medicine

## 2014-07-29 DIAGNOSIS — Z87828 Personal history of other (healed) physical injury and trauma: Secondary | ICD-10-CM | POA: Diagnosis not present

## 2014-07-29 DIAGNOSIS — L02415 Cutaneous abscess of right lower limb: Secondary | ICD-10-CM

## 2014-07-29 DIAGNOSIS — L03115 Cellulitis of right lower limb: Secondary | ICD-10-CM | POA: Insufficient documentation

## 2014-07-29 DIAGNOSIS — Z792 Long term (current) use of antibiotics: Secondary | ICD-10-CM | POA: Insufficient documentation

## 2014-07-29 DIAGNOSIS — M65051 Abscess of tendon sheath, right thigh: Secondary | ICD-10-CM | POA: Insufficient documentation

## 2014-07-29 DIAGNOSIS — Z720 Tobacco use: Secondary | ICD-10-CM | POA: Diagnosis not present

## 2014-07-29 NOTE — ED Notes (Signed)
Pt states he has an abscess to his right groin area that started yesterday  Pt states it hurts to walk  Pt has hx of abscesses in the past

## 2014-07-30 MED ORDER — LIDOCAINE-EPINEPHRINE 2 %-1:100000 IJ SOLN
INTRAMUSCULAR | Status: AC
  Filled 2014-07-30: qty 1

## 2014-07-30 MED ORDER — OXYCODONE-ACETAMINOPHEN 5-325 MG PO TABS
2.0000 | ORAL_TABLET | Freq: Once | ORAL | Status: AC
  Administered 2014-07-30: 2 via ORAL
  Filled 2014-07-30: qty 2

## 2014-07-30 MED ORDER — HYDROCODONE-ACETAMINOPHEN 5-325 MG PO TABS: 1.0000 | ORAL_TABLET | Freq: Four times a day (QID) | ORAL | Status: DC | PRN

## 2014-07-30 MED ORDER — CLINDAMYCIN HCL 150 MG PO CAPS: 300.0000 mg | ORAL_CAPSULE | Freq: Three times a day (TID) | ORAL | Status: DC

## 2014-07-30 MED ORDER — LIDOCAINE-EPINEPHRINE (PF) 2 %-1:200000 IJ SOLN
10.0000 mL | Freq: Once | INTRAMUSCULAR | Status: AC
  Administered 2014-07-30: 10 mL

## 2014-07-30 NOTE — Discharge Instructions (Signed)
Recommend warm soaks or compresses 2-3 times per day to promote drainage. Take clindamycin as prescribed. You take Norco as needed for severe pain. Change your dressing at least once per day to keep the area clean and dry.  Abscess Care After An abscess (also called a boil or furuncle) is an infected area that contains a collection of pus. Signs and symptoms of an abscess include pain, tenderness, redness, or hardness, or you may feel a moveable soft area under your skin. An abscess can occur anywhere in the body. The infection may spread to surrounding tissues causing cellulitis. A cut (incision) by the surgeon was made over your abscess and the pus was drained out. Gauze may have been packed into the space to provide a drain that will allow the cavity to heal from the inside outwards. The boil may be painful for 5 to 7 days. Most people with a boil do not have high fevers. Your abscess, if seen early, may not have localized, and may not have been lanced. If not, another appointment may be required for this if it does not get better on its own or with medications. HOME CARE INSTRUCTIONS   Only take over-the-counter or prescription medicines for pain, discomfort, or fever as directed by your caregiver.  When you bathe, soak and then remove gauze or iodoform packs at least daily or as directed by your caregiver. You may then wash the wound gently with mild soapy water. Repack with gauze or do as your caregiver directs. SEEK IMMEDIATE MEDICAL CARE IF:   You develop increased pain, swelling, redness, drainage, or bleeding in the wound site.  You develop signs of generalized infection including muscle aches, chills, fever, or a general ill feeling.  An oral temperature above 102 F (38.9 C) develops, not controlled by medication. See your caregiver for a recheck if you develop any of the symptoms described above. If medications (antibiotics) were prescribed, take them as directed. Document Released:  11/26/2004 Document Revised: 08/02/2011 Document Reviewed: 07/24/2007 [facility] Patient Information 2015 [place], [place]. This information is not intended to replace advice given to you by your health care provider. Make sure you discuss any questions you have with your health care provider.

## 2014-07-30 NOTE — ED Notes (Signed)
Pt ambulating independently w/ steady gait on d/c in no acute distress, A&Ox4. D/c instructions reviewed w/ pt and family - pt and family deny any further questions or concerns at present. Rx given x2  

## 2014-08-01 ENCOUNTER — Emergency Department (HOSPITAL_COMMUNITY)
Admission: EM | Admit: 2014-08-01 | Discharge: 2014-08-02 | Disposition: A | Discharge status: Home / Self Care | Payer: BLUE CROSS/BLUE SHIELD | Attending: Emergency Medicine | Admitting: Emergency Medicine

## 2014-08-01 ENCOUNTER — Encounter (HOSPITAL_COMMUNITY): Payer: Self-pay | Admitting: Emergency Medicine

## 2014-08-01 DIAGNOSIS — Z720 Tobacco use: Secondary | ICD-10-CM | POA: Insufficient documentation

## 2014-08-01 DIAGNOSIS — Z4801 Encounter for change or removal of surgical wound dressing: Secondary | ICD-10-CM | POA: Diagnosis not present

## 2014-08-01 DIAGNOSIS — Z09 Encounter for follow-up examination after completed treatment for conditions other than malignant neoplasm: Secondary | ICD-10-CM

## 2014-08-01 DIAGNOSIS — Z87828 Personal history of other (healed) physical injury and trauma: Secondary | ICD-10-CM | POA: Insufficient documentation

## 2014-08-01 DIAGNOSIS — Z792 Long term (current) use of antibiotics: Secondary | ICD-10-CM | POA: Insufficient documentation

## 2014-08-01 NOTE — ED Notes (Signed)
Pt states he was seen here Monday night for an abscess on his right inner thigh and had an I&D  Pt states he was told to come in today for recheck  Pt states the area is not getting any better and he is now getting one on his left inner thigh as well  Pt states he was given antibiotics that he is currently still taking
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
## 2014-08-02 MED ORDER — HYDROCODONE-ACETAMINOPHEN 5-325 MG PO TABS: 1.0000 | ORAL_TABLET | ORAL | Status: DC | PRN

## 2014-08-02 NOTE — Discharge Instructions (Signed)
Heat Therapy °Heat therapy can help ease sore, stiff, injured, and tight muscles and joints. Heat relaxes your muscles, which may help ease your pain.  °RISKS AND COMPLICATIONS °If you have any of the following conditions, do not use heat therapy unless your health care provider has approved: °· Poor circulation. °· Healing wounds or scarred skin in the area being treated. °· Diabetes, heart disease, or high blood pressure. °· Not being able to feel (numbness) the area being treated. °· Unusual swelling of the area being treated. °· Active infections. °· Blood clots. °· Cancer. °· Inability to communicate pain. This may include young children and people who have problems with their brain function (dementia). °· Pregnancy. °Heat therapy should only be used on old, pre-existing, or long-lasting (chronic) injuries. Do not use heat therapy on new injuries unless directed by your health care provider. °HOW TO USE HEAT THERAPY °There are several different kinds of heat therapy, including: °· Moist heat pack. °· Warm water bath. °· Hot water bottle. °· Electric heating pad. °· Heated gel pack. °· Heated wrap. °· Electric heating pad. °Use the heat therapy method suggested by your health care provider. Follow your health care provider's instructions on when and how to use heat therapy. °GENERAL HEAT THERAPY RECOMMENDATIONS °· Do not sleep while using heat therapy. Only use heat therapy while you are awake. °· Your skin may turn pink while using heat therapy. Do not use heat therapy if your skin turns red. °· Do not use heat therapy if you have new pain. °· High heat or long exposure to heat can cause burns. Be careful when using heat therapy to avoid burning your skin. °· Do not use heat therapy on areas of your skin that are already irritated, such as with a rash or sunburn. °SEEK MEDICAL CARE IF: °· You have blisters, redness, swelling, or numbness. °· You have new pain. °· Your pain is worse. °MAKE SURE  YOU: °· Understand these instructions. °· Will watch your condition. °· Will get help right away if you are not doing well or get worse. °Document Released: 08/02/2011 Document Revised: 09/24/2013 Document Reviewed: 07/03/2013 °ExitCare® Patient Information ©2015 [employer], LLC. This information is not intended to replace advice given to you by your health care provider. Make sure you discuss any questions you have with your health care provider. ° °

## 2014-08-04 ENCOUNTER — Telehealth (HOSPITAL_BASED_OUTPATIENT_CLINIC_OR_DEPARTMENT_OTHER): Payer: Self-pay | Admitting: Emergency Medicine

## 2014-08-04 LAB — WOUND CULTURE
GRAM STAIN: NONE SEEN
SPECIAL REQUESTS: NORMAL

## 2014-08-04 NOTE — Telephone Encounter (Signed)
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 Resistant to prescribed Clindamycin.   Chart sent to EDP for review
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
## 2014-08-05 ENCOUNTER — Telehealth (HOSPITAL_COMMUNITY): Payer: Self-pay

## 2014-08-05 NOTE — ED Notes (Signed)
Chart returned from edp office. New orders- stop clindamycin and start bactrim DS 1 po bid x 7 days. Per E. Ahw[place] PA.

## 2014-08-05 NOTE — Progress Notes (Signed)
ED Antimicrobial Stewardship Positive Culture Follow Up   [name] is an 39 y.o. male who [facility] with a chief complaint of  Chief Complaint  Patient presents with  . Wound Check    Recent Results (from the past 720 hour(s))  Wound culture     Status: None   Collection Time: 08/02/14 12:11 AM  Result Value Ref Range Status   Specimen Description ABSCESS  Final   Special Requests Normal  Final   Gram Stain   Final    NO WBC SEEN FEW SQUAMOUS EPITHELIAL CELLS PRESENT FEW GRAM POSITIVE COCCI IN PAIRS IN CLUSTERS Performed at [facility]    Culture   Final    ABUNDANT METHICILLIN RESISTANT STAPHYLOCOCCUS AUREUS Note: RIFAMPIN AND GENTAMICIN SHOULD NOT BE USED AS SINGLE DRUGS FOR TREATMENT OF STAPH INFECTIONS. CRITICAL RESULT CALLED TO, READ BACK BY AND VERIFIED WITH: SHANNON GAMMONS @ 9:20 AM 08/04/14 BY DWEEKS Performed at [facility]    Report Status 08/04/2014 FINAL  Final   Organism ID, Bacteria METHICILLIN RESISTANT STAPHYLOCOCCUS AUREUS  Final      Susceptibility   Methicillin resistant staphylococcus aureus - MIC*    CLINDAMYCIN >=8 RESISTANT Resistant     ERYTHROMYCIN >=8 RESISTANT Resistant     GENTAMICIN <=0.5 SENSITIVE Sensitive     LEVOFLOXACIN 0.25 SENSITIVE Sensitive     OXACILLIN >=4 RESISTANT Resistant     PENICILLIN >=0.5 RESISTANT Resistant     RIFAMPIN <=0.5 SENSITIVE Sensitive     TRIMETH/SULFA <=10 SENSITIVE Sensitive     VANCOMYCIN 1 SENSITIVE Sensitive     TETRACYCLINE <=1 SENSITIVE Sensitive     * ABUNDANT METHICILLIN RESISTANT STAPHYLOCOCCUS AUREUS    [x]  Treated with clindamycin, organism resistant to prescribed antimicrobial []  Patient discharged originally without antimicrobial agent and treatment is now indicated  New antibiotic prescription: Bactrim DS 1 tablet BID x 7 days  ED Provider: Trixie DredgeEmily West, PA-C   [name], [name] 08/05/2014, 9:10 AM Infectious Diseases Pharmacist Phone#  [phone]

## 2014-08-12 ENCOUNTER — Telehealth (HOSPITAL_COMMUNITY): Payer: Self-pay

## 2014-08-12 NOTE — ED Notes (Signed)
Unable to reach by telephone. Letter sent to address on record.  

## 2014-08-15 ENCOUNTER — Telehealth (HOSPITAL_BASED_OUTPATIENT_CLINIC_OR_DEPARTMENT_OTHER): Payer: Self-pay | Admitting: Emergency Medicine

## 2014-08-15 NOTE — Telephone Encounter (Signed)
Post ED Visit - Positive Culture Follow-up: Successful Patient Follow-Up  Culture assessed and recommendations reviewed by: []  Wes Dulaney, Pharm.D., BCPS []  Celedonio MiyamotoJeremy Frens, [address] BCPS []  Georgina PillionElizabeth Martin, Pharm.D., BCPS []  BoswellMinh Pham, [address]Ph[address]D., BCPS, AAHIVP []  Estella HuskMichelle Turner, Pharm.D., BCPS, AAHIVP []  Red ChristiansSamson Lee, Pharm.D. []  Cassie Roseanne RenoStewart, [place]D.  Positive abcess +MRSA culture  []  Patient discharged without antimicrobial prescription and treatment is now indicated [x]  Organism is resistant to prescribed ED discharge antimicrobial []  Patient with positive blood cultures  Changes discussed with ED provider: Trixie DredgeEmily West PA New antibiotic prescription Bactrim DS one tablet bid x 7 days [facility] Contacted patient, 08/15/14 1812   Berle MullMiller, Obdulio Mash 08/15/2014, 6:12 PM

## 2014-10-13 ENCOUNTER — Emergency Department (INDEPENDENT_AMBULATORY_CARE_PROVIDER_SITE_OTHER)
Admission: EM | Admit: 2014-10-13 | Discharge: 2014-10-13 | Disposition: A | Discharge status: Home / Self Care | Payer: BLUE CROSS/BLUE SHIELD | Source: Home / Self Care

## 2014-10-13 ENCOUNTER — Encounter (HOSPITAL_COMMUNITY): Payer: Self-pay | Admitting: Emergency Medicine

## 2014-10-13 DIAGNOSIS — K5900 Constipation, unspecified: Secondary | ICD-10-CM

## 2014-10-13 DIAGNOSIS — K625 Hemorrhage of anus and rectum: Secondary | ICD-10-CM

## 2014-10-13 NOTE — Discharge Instructions (Signed)
Bloody Stools Bloody stools often mean that there is a problem in the digestive tract. Your caregiver may use the term "melena" to describe black, tarry, and bad smelling stools or "hematochezia" to describe red or maroon-colored stools. Blood seen in the stool can be caused by bleeding anywhere along the intestinal tract.  A black stool usually means that blood is coming from the upper part of the gastrointestinal tract (esophagus, stomach, or small bowel). Passing maroon-colored stools or bright red blood usually means that blood is coming from lower down in the large bowel or the rectum. However, sometimes massive bleeding in the stomach or small intestine can cause bright red bloody stools.  Consuming black licorice, lead, iron pills, medicines containing bismuth subsalicylate, or blueberries can also cause black stools. Your caregiver can test black stools to see if blood is present. It is important that the cause of the bleeding be found. Treatment can then be started, and the problem can be corrected. Rectal bleeding may not be serious, but you should not assume everything is okay until you know the cause.It is very important to follow up with your caregiver or a specialist in gastrointestinal problems. CAUSES  Blood in the stools can come from various underlying causes.Often, the cause is not found during your first visit. Testing is often needed to discover the cause of bleeding in the gastrointestinal tract. Causes range from simple to serious or even life-threatening.Possible causes include:  Hemorrhoids.These are veins that are full of blood (engorged) in the rectum. They cause pain, inflammation, and may bleed.  Anal fissures.These are areas of painful tearing which may bleed. They are often caused by passing hard stool.  Diverticulosis.These are pouches that form on the colon over time, with age, and may bleed significantly.  Diverticulitis.This is inflammation in areas with  diverticulosis. It can cause pain, fever, and bloody stools, although bleeding is rare.  Proctitis and colitis. These are inflamed areas of the rectum or colon. They may cause pain, fever, and bloody stools.  Polyps and cancer. Colon cancer is a leading cause of preventable cancer death.It often starts out as precancerous polyps that can be removed during a colonoscopy, preventing progression into cancer. Sometimes, polyps and cancer may cause rectal bleeding.  Gastritis and ulcers.Bleeding from the upper gastrointestinal tract (near the stomach) may travel through the intestines and produce black, sometimes tarry, often bad smelling stools. In certain cases, if the bleeding is fast enough, the stools may not be black, but red and the condition may be life-threatening. SYMPTOMS  You may have stools that are bright red and bloody, that are normal color with blood on them, or that are dark black and tarry. In some cases, you may only have blood in the toilet bowl. Any of these cases need medical care. You may also have:  Pain at the anus or anywhere in the rectum.  Lightheadedness or feeling faint.  Extreme weakness.  Nausea or vomiting.  Fever. DIAGNOSIS Your caregiver may use the following methods to find the cause of your bleeding:  Taking a medical history. Age is important. Older people tend to develop polyps and cancer more often. If there is anal pain and a hard, large stool associated with bleeding, a tear of the anus may be the cause. If blood drips into the toilet after a bowel movement, bleeding hemorrhoids may be the problem. The color and frequency of the bleeding are additional considerations. In most cases, the medical history provides clues, but seldom the final  answer.  A visual and finger (digital) exam. Your caregiver will inspect the anal area, looking for tears and hemorrhoids. A finger exam can provide information when there is tenderness or a growth inside. In men, the  prostate is also examined.  Endoscopy. Several types of small, long scopes (endoscopes) are used to view the colon.  In the office, your caregiver may use a rigid, or more commonly, a flexible viewing sigmoidoscope. This exam is called flexible sigmoidoscopy. It is performed in 5 to 10 minutes.  A more thorough exam is accomplished with a colonoscope. It allows your caregiver to view the entire 5 to 6 foot long colon. Medicine to help you relax (sedative) is usually given for this exam. Frequently, a bleeding lesion may be present beyond the reach of the sigmoidoscope. So, a colonoscopy may be the best exam to start with. Both exams are usually done on an outpatient basis. This means the patient does not stay overnight in the hospital or surgery center.  An upper endoscopy may be needed to examine your stomach. Sedation is used and a flexible endoscope is put in your mouth, down to your stomach.  A barium enema X-ray. This is an X-ray exam. It uses liquid barium inserted by enema into the rectum. This test alone may not identify an actual bleeding point. X-rays highlight abnormal shadows, such as those made by lumps (tumors), diverticuli, or colitis. TREATMENT  Treatment depends on the cause of your bleeding.   For bleeding from the stomach or colon, the caregiver doing your endoscopy or colonoscopy may be able to stop the bleeding as part of the procedure.  Inflammation or infection of the colon can be treated with medicines.  Many rectal problems can be treated with creams, suppositories, or warm baths.  Surgery is sometimes needed.  Blood transfusions are sometimes needed if you have lost a lot of blood.  For any bleeding problem, let your caregiver know if you take aspirin or other blood thinners regularly. HOME CARE INSTRUCTIONS   Take any medicines exactly as prescribed.  Keep your stools soft by eating a diet high in fiber. Prunes (1 to 3 a day) work well for many people.  Drink  enough water and fluids to keep your urine clear or pale yellow.  Take sitz baths if advised. A sitz bath is when you sit in a bathtub with warm water for 10 to 15 minutes to soak, soothe, and cleanse the rectal area.  If enemas or suppositories are advised, be sure you know how to use them. Tell your caregiver if you have problems with this.  Monitor your bowel movements to look for signs of improvement or worsening. SEEK MEDICAL CARE IF:   You do not improve in the time expected.  Your condition worsens after initial improvement.  You develop any new symptoms. SEEK IMMEDIATE MEDICAL CARE IF:   You develop severe or prolonged rectal bleeding.  You vomit blood.  You feel weak or faint.  You have a fever. MAKE SURE YOU:  Understand these instructions.  Will watch your condition.  Will get help right away if you are not doing well or get worse. Document Released: 04/30/2002 Document Revised: 08/02/2011 Document Reviewed: 09/25/2010 [facility] Patient Information 2015 [place], [place]. This information is not intended to replace advice given to you by your health care provider. Make sure you discuss any questions you have with your health care provider.   Please drink plenty of water, increase fiber in diiet. Marland Kitchen Avoid  NSAIDS (ibuprofen,Aleve, Motrin, Aspirin,etc) as it may aggravate issue. Please use OTC Miralax and stool softener of choice to aid in constipation relief. Do not strain to have BM. I have referred you to [facility] for further evaluation call next week for appt-if you haven't heard from them. Return to UC as needed. Go immediately to Er for further eval if you develop worsening rectal bleeding, pain, N,V,etc.
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
## 2014-10-13 NOTE — ED Notes (Signed)
C/o  Blood in stool x 1 wk.   No otc treatment tried.  Denies any other symptoms.

## 2014-10-23 ENCOUNTER — Encounter: Payer: Self-pay | Admitting: Physician Assistant

## 2014-11-04 ENCOUNTER — Emergency Department (HOSPITAL_COMMUNITY)
Admission: EM | Admit: 2014-11-04 | Discharge: 2014-11-04 | Disposition: A | Discharge status: Home / Self Care | Payer: BLUE CROSS/BLUE SHIELD | Attending: Emergency Medicine | Admitting: Emergency Medicine

## 2014-11-04 ENCOUNTER — Encounter (HOSPITAL_COMMUNITY): Payer: Self-pay | Admitting: Emergency Medicine

## 2014-11-04 DIAGNOSIS — X58XXXA Exposure to other specified factors, initial encounter: Secondary | ICD-10-CM | POA: Diagnosis not present

## 2014-11-04 DIAGNOSIS — Y9311 Activity, swimming: Secondary | ICD-10-CM | POA: Diagnosis not present

## 2014-11-04 DIAGNOSIS — Y9289 Other specified places as the place of occurrence of the external cause: Secondary | ICD-10-CM | POA: Diagnosis not present

## 2014-11-04 DIAGNOSIS — R519 Headache, unspecified: Secondary | ICD-10-CM

## 2014-11-04 DIAGNOSIS — Z720 Tobacco use: Secondary | ICD-10-CM | POA: Insufficient documentation

## 2014-11-04 DIAGNOSIS — Y998 Other external cause status: Secondary | ICD-10-CM | POA: Insufficient documentation

## 2014-11-04 DIAGNOSIS — R51 Headache: Secondary | ICD-10-CM

## 2014-11-04 DIAGNOSIS — S060X0A Concussion without loss of consciousness, initial encounter: Secondary | ICD-10-CM | POA: Diagnosis not present

## 2014-11-04 DIAGNOSIS — S0990XA Unspecified injury of head, initial encounter: Secondary | ICD-10-CM | POA: Diagnosis present

## 2014-11-04 MED ORDER — METOCLOPRAMIDE HCL 5 MG/ML IJ SOLN
10.0000 mg | Freq: Once | INTRAMUSCULAR | Status: AC
  Administered 2014-11-04: 10 mg via INTRAVENOUS
  Filled 2014-11-04: qty 2

## 2014-11-04 MED ORDER — KETOROLAC TROMETHAMINE 30 MG/ML IJ SOLN
30.0000 mg | Freq: Once | INTRAMUSCULAR | Status: AC
  Administered 2014-11-04: 30 mg via INTRAVENOUS
  Filled 2014-11-04: qty 1

## 2014-11-04 MED ORDER — SODIUM CHLORIDE 0.9 % IV BOLUS (SEPSIS)
1000.0000 mL | Freq: Once | INTRAVENOUS | Status: AC
  Administered 2014-11-04: 1000 mL via INTRAVENOUS

## 2014-11-04 MED ORDER — DIPHENHYDRAMINE HCL 50 MG/ML IJ SOLN
25.0000 mg | Freq: Once | INTRAMUSCULAR | Status: AC
  Administered 2014-11-04: 25 mg via INTRAVENOUS
  Filled 2014-11-04: qty 1
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
## 2014-11-04 NOTE — ED Notes (Signed)
Pt c/o headache x 1 day, pt denies vomiting but has be nauseous.

## 2014-11-04 NOTE — Discharge Instructions (Signed)
General Headache Without Cause  A headache is pain or discomfort felt around the head or neck area. The specific cause of a headache may not be found. There are many causes and types of headaches. A few common ones are:  · Tension headaches.  · Migraine headaches.  · Cluster headaches.  · Chronic daily headaches.  HOME CARE INSTRUCTIONS   · Keep all follow-up appointments with your caregiver or any specialist referral.  · Only take over-the-counter or prescription medicines for pain or discomfort as directed by your caregiver.  · Lie down in a dark, quiet room when you have a headache.  · Keep a headache journal to find out what may trigger your migraine headaches. For example, write down:  ¨ What you eat and drink.  ¨ How much sleep you get.  ¨ Any change to your diet or medicines.  · Try massage or other relaxation techniques.  · Put ice packs or heat on the head and neck. Use these 3 to 4 times per day for 15 to 20 minutes each time, or as needed.  · Limit stress.  · Sit up straight, and do not tense your muscles.  · Quit smoking if you smoke.  · Limit alcohol use.  · Decrease the amount of caffeine you drink, or stop drinking caffeine.  · Eat and sleep on a regular schedule.  · Get 7 to 9 hours of sleep, or as recommended by your caregiver.  · Keep lights dim if bright lights bother you and make your headaches worse.  SEEK MEDICAL CARE IF:   · You have problems with the medicines you were prescribed.  · Your medicines are not working.  · You have a change from the usual headache.  · You have nausea or vomiting.  SEEK IMMEDIATE MEDICAL CARE IF:   · Your headache becomes severe.  · You have a fever.  · You have a stiff neck.  · You have loss of vision.  · You have muscular weakness or loss of muscle control.  · You start losing your balance or have trouble walking.  · You feel faint or pass out.  · You have severe symptoms that are different from your first symptoms.  MAKE SURE YOU:   · Understand these  instructions.  · Will watch your condition.  · Will get help right away if you are not doing well or get worse.  Document Released: 05/10/2005 Document Revised: 08/02/2011 Document Reviewed: 05/26/2011  ExitCare® Patient Information ©2015 [employer]. This information is not intended to replace advice given to you by your health care provider. Make sure you discuss any questions you have with your health care provider.    Concussion  A concussion, or closed-head injury, is a brain injury caused by a direct blow to the head or by a quick and sudden movement (jolt) of the head or neck. Concussions are usually not life-threatening. Even so, the effects of a concussion can be serious. If you have had a concussion before, you are more likely to experience concussion-like symptoms after a direct blow to the head.   CAUSES  · Direct blow to the head, such as from running into another player during a soccer game, being hit in a fight, or hitting your head on a hard surface.  · A jolt of the head or neck that causes the brain to move back and forth inside the skull, such as in a car crash.  SIGNS AND SYMPTOMS    The signs of a concussion can be hard to notice. Early on, they may be missed by you, family members, and health care providers. You may look fine but act or feel differently.  Symptoms are usually temporary, but they may last for days, weeks, or even longer. Some symptoms may appear right away while others may not show up for hours or days. Every head injury is different. Symptoms include:  · Mild to moderate headaches that will not go away.  · A feeling of pressure inside your head.  · Having more trouble than usual:  ¨ Learning or remembering things you have heard.  ¨ Answering questions.  ¨ Paying attention or concentrating.  ¨ Organizing daily tasks.  ¨ Making decisions and solving problems.  · Slowness in thinking, acting or reacting, speaking, or reading.  · Getting lost or being easily confused.  · Feeling  tired all the time or lacking energy (fatigued).  · Feeling drowsy.  · Sleep disturbances.  ¨ Sleeping more than usual.  ¨ Sleeping less than usual.  ¨ Trouble falling asleep.  ¨ Trouble sleeping (insomnia).  · Loss of balance or feeling lightheaded or dizzy.  · Nausea or vomiting.  · Numbness or tingling.  · Increased sensitivity to:  ¨ Sounds.  ¨ Lights.  ¨ Distractions.  · Vision problems or eyes that tire easily.  · Diminished sense of taste or smell.  · Ringing in the ears.  · Mood changes such as feeling sad or anxious.  · Becoming easily irritated or angry for little or no reason.  · Lack of motivation.  · Seeing or hearing things other people do not see or hear (hallucinations).  DIAGNOSIS  Your health care provider can usually diagnose a concussion based on a description of your injury and symptoms. He or she will ask whether you passed out (lost consciousness) and whether you are having trouble remembering events that happened right before and during your injury.  Your evaluation might include:  · A brain scan to look for signs of injury to the brain. Even if the test shows no injury, you may still have a concussion.  · Blood tests to be sure other problems are not present.  TREATMENT  · Concussions are usually treated in an emergency department, in urgent care, or at a clinic. You may need to stay in the hospital overnight for further treatment.  · Tell your health care provider if you are taking any medicines, including prescription medicines, over-the-counter medicines, and natural remedies. Some medicines, such as blood thinners (anticoagulants) and aspirin, may increase the chance of complications. Also tell your health care provider whether you have had alcohol or are taking illegal drugs. This information may affect treatment.  · Your health care provider will send you home with important instructions to follow.  · How fast you will recover from a concussion depends on many factors. These factors  include how severe your concussion is, what part of your brain was injured, your age, and how healthy you were before the concussion.  · Most people with mild injuries recover fully. Recovery can take time. In general, recovery is slower in older persons. Also, persons who have had a concussion in the past or have other medical problems may find that it takes longer to recover from their current injury.  HOME CARE INSTRUCTIONS  General Instructions  · Carefully follow the directions your health care provider gave you.  · Only take over-the-counter or prescription medicines for pain,   discomfort, or fever as directed by your health care provider.  · Take only those medicines that your health care provider has approved.  · Do not drink alcohol until your health care provider says you are well enough to do so. Alcohol and certain other drugs may slow your recovery and can put you at risk of further injury.  · If it is harder than usual to remember things, write them down.  · If you are easily distracted, try to do one thing at a time. For example, do not try to watch TV while fixing dinner.  · Talk with family members or close friends when making important decisions.  · Keep all follow-up appointments. Repeated evaluation of your symptoms is recommended for your recovery.  · Watch your symptoms and tell others to do the same. Complications sometimes occur after a concussion. Older adults with a brain injury may have a higher risk of serious complications, such as a blood clot on the brain.  · Tell your teachers, school nurse, school counselor, coach, athletic trainer, or work manager about your injury, symptoms, and restrictions. Tell them about what you can or cannot do. They should watch for:  ¨ Increased problems with attention or concentration.  ¨ Increased difficulty remembering or learning new information.  ¨ Increased time needed to complete tasks or assignments.  ¨ Increased irritability or decreased ability to  cope with stress.  ¨ Increased symptoms.  · Rest. Rest helps the brain to heal. Make sure you:  ¨ Get plenty of sleep at night. Avoid staying up late at night.  ¨ Keep the same bedtime hours on weekends and weekdays.  ¨ Rest during the day. Take daytime naps or rest breaks when you feel tired.  · Limit activities that require a lot of thought or concentration. These include:  ¨ Doing homework or job-related work.  ¨ Watching TV.  ¨ Working on the computer.  · Avoid any situation where there is potential for another head injury (football, hockey, soccer, basketball, martial arts, downhill snow sports and horseback riding). Your condition will get worse every time you experience a concussion. You should avoid these activities until you are evaluated by the appropriate follow-up health care providers.  Returning To Your Regular Activities  You will need to return to your normal activities slowly, not all at once. You must give your body and brain enough time for recovery.  · Do not return to sports or other athletic activities until your health care provider tells you it is safe to do so.  · Ask your health care provider when you can drive, ride a bicycle, or operate heavy machinery. Your ability to react may be slower after a brain injury. Never do these activities if you are dizzy.  · Ask your health care provider about when you can return to work or school.  Preventing Another Concussion  It is very important to avoid another brain injury, especially before you have recovered. In rare cases, another injury can lead to permanent brain damage, brain swelling, or death. The risk of this is greatest during the first 7-10 days after a head injury. Avoid injuries by:  · Wearing a seat belt when riding in a car.  · Drinking alcohol only in moderation.  · Wearing a helmet when biking, skiing, skateboarding, skating, or doing similar activities.  · Avoiding activities that could lead to a second concussion, such as contact  or recreational sports, until your health care provider says it   is okay.  · Taking safety measures in your home.  ¨ Remove clutter and tripping hazards from floors and stairways.  ¨ Use grab bars in bathrooms and handrails by stairs.  ¨ Place non-slip mats on floors and in bathtubs.  ¨ Improve lighting in dim areas.  SEEK MEDICAL CARE IF:  · You have increased problems paying attention or concentrating.  · You have increased difficulty remembering or learning new information.  · You need more time to complete tasks or assignments than before.  · You have increased irritability or decreased ability to cope with stress.  · You have more symptoms than before.  Seek medical care if you have any of the following symptoms for more than 2 weeks after your injury:  · Lasting (chronic) headaches.  · Dizziness or balance problems.  · Nausea.  · Vision problems.  · Increased sensitivity to noise or light.  · Depression or mood swings.  · Anxiety or irritability.  · Memory problems.  · Difficulty concentrating or paying attention.  · Sleep problems.  · Feeling tired all the time.  SEEK IMMEDIATE MEDICAL CARE IF:  · You have severe or worsening headaches. These may be a sign of a blood clot in the brain.  · You have weakness (even if only in one hand, leg, or part of the face).  · You have numbness.  · You have decreased coordination.  · You vomit repeatedly.  · You have increased sleepiness.  · One pupil is larger than the other.  · You have convulsions.  · You have slurred speech.  · You have increased confusion. This may be a sign of a blood clot in the brain.  · You have increased restlessness, agitation, or irritability.  · You are unable to recognize people or places.  · You have neck pain.  · It is difficult to wake you up.  · You have unusual behavior changes.  · You lose consciousness.  MAKE SURE YOU:  · Understand these instructions.  · Will watch your condition.  · Will get help right away if you are not doing well or  get worse.  Document Released: 07/31/2003 Document Revised: 05/15/2013 Document Reviewed: 11/30/2012  ExitCare® Patient Information ©2015 [employer]. This information is not intended to replace advice given to you by your health care provider. Make sure you discuss any questions you have with your health care provider.

## 2014-11-04 NOTE — Progress Notes (Signed)
EDCM spoke to patient and his wife at bedside.  Patient's wife confirms patient has [employer] without a pcp.  EDCM provided patient's wife with list of pcps who accept [employer] insurnace within a 10 mile radius of patient's zip code [address].  Discussed importance and purpose of having a pcp.  Patient's wife thankful for resources.  No further EDCM needs at this time.

## 2014-11-13 ENCOUNTER — Ambulatory Visit: Payer: BLUE CROSS/BLUE SHIELD | Admitting: Physician Assistant

## 2015-01-16 ENCOUNTER — Emergency Department (HOSPITAL_COMMUNITY)
Admission: EM | Admit: 2015-01-16 | Discharge: 2015-01-16 | Disposition: A | Discharge status: Home / Self Care | Payer: BLUE CROSS/BLUE SHIELD | Attending: Emergency Medicine | Admitting: Emergency Medicine

## 2015-01-16 ENCOUNTER — Encounter (HOSPITAL_COMMUNITY): Payer: Self-pay

## 2015-01-16 DIAGNOSIS — M79604 Pain in right leg: Secondary | ICD-10-CM | POA: Diagnosis present

## 2015-01-16 DIAGNOSIS — Z720 Tobacco use: Secondary | ICD-10-CM | POA: Insufficient documentation

## 2015-01-16 DIAGNOSIS — G5711 Meralgia paresthetica, right lower limb: Secondary | ICD-10-CM | POA: Insufficient documentation

## 2015-01-16 DIAGNOSIS — Z87828 Personal history of other (healed) physical injury and trauma: Secondary | ICD-10-CM | POA: Diagnosis not present

## 2015-01-16 MED ORDER — GABAPENTIN 100 MG PO CAPS: 100.0000 mg | ORAL_CAPSULE | Freq: Three times a day (TID) | ORAL | Status: DC

## 2015-01-16 NOTE — ED Notes (Signed)
Pt c/o R upper leg pain x "over a month."  Pain score 7/10.  Pt reports "it's like a shooting pain and then, it feels like a burning.  It gets worse if something rubs against it."  Denies injury.  No warmth, redness, or swelling noted.

## 2015-01-16 NOTE — Discharge Instructions (Signed)
Meralgia paraesthetica is a nerve (neurological) condition that causes pain in the outer thigh. It is caused by compression of a nerve called the lateral cutaneous nerve of the thigh. This nerve supplies feeling (sensation) to the outer thigh. In many cases, the cause is not known. Usually the condition improves with conservative (non-surgical) treatment - such as anti-inflammatories, painkillers or steroid injections.  What is meralgia paraesthetica? Meralgia paraesthetica is a nerve (neurological) condition that causes an area of skin over the upper outer thigh to become painful, numb or tingly.  Meralgia paraesthetica is known as a nerve entrapment syndrome. This means it is a collection of symptoms caused by a trapped or compressed nerve. The trapped nerve in question is called the lateral cutaneous nerve of the thigh (also known as the lateral femoral nerve).  What is the treatment for meralgia paraesthetica? Treatments can be grouped into conservative treatments (which are non-surgical), and surgical treatment (operations). In most cases, only conservative treatments are needed.  Examples of conservative treatments include:  Rest - meralgia paraesthetica is aggravated by standing and walking. Reduction in physical activity may be advised in severe cases. It may even be necessary to rest in bed. Weight loss - if obesity is thought to be the cause. Physical therapies - manipulation, massage and stretching exercises sometimes help. Painkillers (analgesics) - such as paracetamol or codeine. Non-steroidal anti-inflammatory drugs (NSAIDs) - such as ibuprofen, naproxen and diclofenac. Corticosteroid injections - commonly referred to as steroid injections. A steroid and, usually some local anaesthetic, can be injected around the lateral cutaneous nerve to numb it and reduce inflammation. Other medicines - sometimes medications are used that act as nerve painkillers. Some types of antidepressant  medications (tricyclic antidepressants) such as amitriptyline or anticonvulsant drugs can be useful for nerve-related pain (also called neuralgia or neuropathic pain). Examples of these drugs include gabapentin (brand name Neurontin), pregabalin (brand name Lyrica) and carbamazepine (brand name Tegretol). If you have meralgia paraesthetica it is also advisable to avoid tight clothing, such as belts or corsets, that presses on the upper thigh/hip area.  Surgical treatment involves taking the pressure off the nerve (surgical decompression) and releasing any entrapment.  What is the outlook (prognosis) for meralgia paraesthetica? Generally, the prognosis is good. Often, the symptoms of pain and pins and needles resolve with time but sometimes the numbness and altered sensation can remain, long-term. But, if there is a serious underlying cause of the entrapment (rare), the prognosis is less good.
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
## 2015-05-12 ENCOUNTER — Encounter (HOSPITAL_COMMUNITY): Payer: Self-pay | Admitting: Nurse Practitioner

## 2015-05-12 ENCOUNTER — Emergency Department (HOSPITAL_COMMUNITY)
Admission: EM | Admit: 2015-05-12 | Discharge: 2015-05-13 | Disposition: A | Discharge status: Home / Self Care | Payer: BLUE CROSS/BLUE SHIELD | Attending: Emergency Medicine | Admitting: Emergency Medicine

## 2015-05-12 DIAGNOSIS — Z79899 Other long term (current) drug therapy: Secondary | ICD-10-CM | POA: Insufficient documentation

## 2015-05-12 DIAGNOSIS — L02416 Cutaneous abscess of left lower limb: Secondary | ICD-10-CM | POA: Insufficient documentation

## 2015-05-12 DIAGNOSIS — L989 Disorder of the skin and subcutaneous tissue, unspecified: Secondary | ICD-10-CM | POA: Diagnosis present

## 2015-05-12 DIAGNOSIS — Z87828 Personal history of other (healed) physical injury and trauma: Secondary | ICD-10-CM | POA: Insufficient documentation

## 2015-05-12 DIAGNOSIS — F17210 Nicotine dependence, cigarettes, uncomplicated: Secondary | ICD-10-CM | POA: Diagnosis not present

## 2015-05-12 DIAGNOSIS — L0201 Cutaneous abscess of face: Secondary | ICD-10-CM | POA: Insufficient documentation

## 2015-05-12 DIAGNOSIS — L02818 Cutaneous abscess of other sites: Secondary | ICD-10-CM

## 2015-05-12 DIAGNOSIS — Z8614 Personal history of Methicillin resistant Staphylococcus aureus infection: Secondary | ICD-10-CM | POA: Insufficient documentation
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
## 2015-05-13 MED ORDER — TRAMADOL HCL 50 MG PO TABS: 50.0000 mg | ORAL_TABLET | Freq: Four times a day (QID) | ORAL | Status: DC | PRN

## 2015-05-13 MED ORDER — TRAMADOL HCL 50 MG PO TABS
50.0000 mg | ORAL_TABLET | Freq: Once | ORAL | Status: AC
  Administered 2015-05-13: 50 mg via ORAL
  Filled 2015-05-13: qty 1

## 2015-05-13 MED ORDER — SULFAMETHOXAZOLE-TRIMETHOPRIM 800-160 MG PO TABS
2.0000 | ORAL_TABLET | Freq: Once | ORAL | Status: AC
  Administered 2015-05-13: 2 via ORAL
  Filled 2015-05-13: qty 2

## 2015-05-13 MED ORDER — SULFAMETHOXAZOLE-TRIMETHOPRIM 800-160 MG PO TABS: 2.0000 | ORAL_TABLET | Freq: Two times a day (BID) | ORAL | Status: AC

## 2015-05-13 NOTE — Discharge Instructions (Signed)
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
## 2015-05-25 DIAGNOSIS — M65062 Abscess of tendon sheath, left lower leg: Secondary | ICD-10-CM

## 2015-05-25 DIAGNOSIS — S80852A Superficial foreign body, left lower leg, initial encounter: Secondary | ICD-10-CM

## 2015-05-25 HISTORY — DX: Abscess of tendon sheath, left lower leg: M65.062

## 2015-05-25 HISTORY — DX: Superficial foreign body, left lower leg, initial encounter: S80.852A

## 2015-06-05 ENCOUNTER — Other Ambulatory Visit: Payer: Self-pay | Admitting: Orthopedic Surgery

## 2015-06-05 ENCOUNTER — Encounter (HOSPITAL_BASED_OUTPATIENT_CLINIC_OR_DEPARTMENT_OTHER): Payer: Self-pay | Admitting: *Deleted

## 2015-06-06 ENCOUNTER — Ambulatory Visit (HOSPITAL_BASED_OUTPATIENT_CLINIC_OR_DEPARTMENT_OTHER): Payer: BLUE CROSS/BLUE SHIELD | Admitting: Anesthesiology

## 2015-06-06 ENCOUNTER — Encounter (HOSPITAL_BASED_OUTPATIENT_CLINIC_OR_DEPARTMENT_OTHER)
Admission: RE | Disposition: A | Discharge status: Home / Self Care | Payer: Self-pay | Source: Ambulatory Visit | Attending: Orthopedic Surgery

## 2015-06-06 ENCOUNTER — Ambulatory Visit (HOSPITAL_BASED_OUTPATIENT_CLINIC_OR_DEPARTMENT_OTHER)
Admission: RE | Admit: 2015-06-06 | Discharge: 2015-06-06 | Disposition: A | Discharge status: Home / Self Care | Payer: BLUE CROSS/BLUE SHIELD | Source: Ambulatory Visit | Attending: Orthopedic Surgery | Admitting: Orthopedic Surgery

## 2015-06-06 ENCOUNTER — Encounter (HOSPITAL_BASED_OUTPATIENT_CLINIC_OR_DEPARTMENT_OTHER): Payer: Self-pay | Admitting: *Deleted

## 2015-06-06 DIAGNOSIS — S80852A Superficial foreign body, left lower leg, initial encounter: Secondary | ICD-10-CM

## 2015-06-06 DIAGNOSIS — L089 Local infection of the skin and subcutaneous tissue, unspecified: Secondary | ICD-10-CM

## 2015-06-06 DIAGNOSIS — M65062 Abscess of tendon sheath, left lower leg: Secondary | ICD-10-CM | POA: Diagnosis present

## 2015-06-06 DIAGNOSIS — Z8614 Personal history of Methicillin resistant Staphylococcus aureus infection: Secondary | ICD-10-CM | POA: Diagnosis not present

## 2015-06-06 DIAGNOSIS — M795 Residual foreign body in soft tissue: Secondary | ICD-10-CM | POA: Insufficient documentation

## 2015-06-06 DIAGNOSIS — F17210 Nicotine dependence, cigarettes, uncomplicated: Secondary | ICD-10-CM | POA: Diagnosis not present

## 2015-06-06 HISTORY — PX: INCISION AND DRAINAGE ABSCESS: SHX5864

## 2015-06-06 HISTORY — PX: FOREIGN BODY REMOVAL: SHX962

## 2015-06-06 HISTORY — DX: Abscess of tendon sheath, left lower leg: M65.062

## 2015-06-06 HISTORY — DX: Personal history of Methicillin resistant Staphylococcus aureus infection: Z86.14

## 2015-06-06 HISTORY — DX: Local infection of the skin and subcutaneous tissue, unspecified: L08.9

## 2015-06-06 HISTORY — DX: Superficial foreign body, left lower leg, initial encounter: S80.852A

## 2015-06-06 SURGERY — INCISION AND DRAINAGE, ABSCESS
Anesthesia: General | Site: Leg Lower | Laterality: Left

## 2015-06-06 MED ORDER — PHENYLEPHRINE 40 MCG/ML (10ML) SYRINGE FOR IV PUSH (FOR BLOOD PRESSURE SUPPORT)
PREFILLED_SYRINGE | INTRAVENOUS | Status: AC
  Filled 2015-06-06: qty 10

## 2015-06-06 MED ORDER — ONDANSETRON HCL 4 MG/2ML IJ SOLN
INTRAMUSCULAR | Status: DC | PRN
  Administered 2015-06-06: 4 mg via INTRAVENOUS

## 2015-06-06 MED ORDER — VANCOMYCIN HCL 1000 MG IV SOLR
1000.0000 mg | INTRAVENOUS | Status: DC | PRN
  Administered 2015-06-06: 1000 mg via INTRAVENOUS

## 2015-06-06 MED ORDER — DEXAMETHASONE SODIUM PHOSPHATE 4 MG/ML IJ SOLN
INTRAMUSCULAR | Status: DC | PRN
  Administered 2015-06-06: 10 mg via INTRAVENOUS

## 2015-06-06 MED ORDER — PROPOFOL 10 MG/ML IV BOLUS
INTRAVENOUS | Status: DC | PRN
  Administered 2015-06-06: 200 mg via INTRAVENOUS

## 2015-06-06 MED ORDER — SULFAMETHOXAZOLE-TRIMETHOPRIM 800-160 MG PO TABS: 1.0000 | ORAL_TABLET | Freq: Two times a day (BID) | ORAL | Status: DC

## 2015-06-06 MED ORDER — LACTATED RINGERS IV SOLN
INTRAVENOUS | Status: DC
  Administered 2015-06-06 (×2): via INTRAVENOUS

## 2015-06-06 MED ORDER — FENTANYL CITRATE (PF) 100 MCG/2ML IJ SOLN
INTRAMUSCULAR | Status: AC
  Filled 2015-06-06: qty 2

## 2015-06-06 MED ORDER — MIDAZOLAM HCL 2 MG/2ML IJ SOLN
INTRAMUSCULAR | Status: AC
  Filled 2015-06-06: qty 2

## 2015-06-06 MED ORDER — VANCOMYCIN HCL IN DEXTROSE 1-5 GM/200ML-% IV SOLN
INTRAVENOUS | Status: AC
  Filled 2015-06-06: qty 200

## 2015-06-06 MED ORDER — HYDROMORPHONE HCL 1 MG/ML IJ SOLN
0.2500 mg | INTRAMUSCULAR | Status: DC | PRN
  Administered 2015-06-06 (×2): 0.5 mg via INTRAVENOUS

## 2015-06-06 MED ORDER — DEXAMETHASONE SODIUM PHOSPHATE 10 MG/ML IJ SOLN
INTRAMUSCULAR | Status: AC
  Filled 2015-06-06: qty 1

## 2015-06-06 MED ORDER — PROPOFOL 500 MG/50ML IV EMUL
INTRAVENOUS | Status: AC
  Filled 2015-06-06: qty 50

## 2015-06-06 MED ORDER — HYDROMORPHONE HCL 1 MG/ML IJ SOLN
INTRAMUSCULAR | Status: AC
  Filled 2015-06-06: qty 1

## 2015-06-06 MED ORDER — CEFAZOLIN SODIUM-DEXTROSE 2-3 GM-% IV SOLR
INTRAVENOUS | Status: AC
  Filled 2015-06-06: qty 50

## 2015-06-06 MED ORDER — PROMETHAZINE HCL 25 MG/ML IJ SOLN: 6.2500 mg | INTRAMUSCULAR | Status: DC | PRN

## 2015-06-06 MED ORDER — ONDANSETRON HCL 4 MG/2ML IJ SOLN
INTRAMUSCULAR | Status: AC
  Filled 2015-06-06: qty 2

## 2015-06-06 MED ORDER — SUCCINYLCHOLINE CHLORIDE 20 MG/ML IJ SOLN
INTRAMUSCULAR | Status: AC
  Filled 2015-06-06: qty 1

## 2015-06-06 MED ORDER — DOXYCYCLINE HYCLATE 50 MG PO CAPS: 50.0000 mg | ORAL_CAPSULE | Freq: Two times a day (BID) | ORAL | Status: DC

## 2015-06-06 MED ORDER — LIDOCAINE HCL (CARDIAC) 20 MG/ML IV SOLN
INTRAVENOUS | Status: AC
  Filled 2015-06-06: qty 5

## 2015-06-06 MED ORDER — MIDAZOLAM HCL 2 MG/2ML IJ SOLN
1.0000 mg | INTRAMUSCULAR | Status: DC | PRN
  Administered 2015-06-06: 2 mg via INTRAVENOUS

## 2015-06-06 MED ORDER — GLYCOPYRROLATE 0.2 MG/ML IJ SOLN: 0.2000 mg | Freq: Once | INTRAMUSCULAR | Status: DC | PRN

## 2015-06-06 MED ORDER — HYDROCODONE-ACETAMINOPHEN 5-325 MG PO TABS: 1.0000 | ORAL_TABLET | Freq: Four times a day (QID) | ORAL | Status: DC | PRN

## 2015-06-06 MED ORDER — FENTANYL CITRATE (PF) 100 MCG/2ML IJ SOLN
50.0000 ug | INTRAMUSCULAR | Status: DC | PRN
  Administered 2015-06-06: 100 ug via INTRAVENOUS

## 2015-06-06 MED ORDER — SCOPOLAMINE 1 MG/3DAYS TD PT72: 1.0000 | MEDICATED_PATCH | Freq: Once | TRANSDERMAL | Status: DC

## 2015-06-06 MED ORDER — LIDOCAINE HCL (CARDIAC) 20 MG/ML IV SOLN
INTRAVENOUS | Status: DC | PRN
  Administered 2015-06-06: 75 mg via INTRAVENOUS

## 2015-06-06 MED ORDER — ATROPINE SULFATE 0.4 MG/ML IJ SOLN
INTRAMUSCULAR | Status: AC
  Filled 2015-06-06: qty 1

## 2015-06-06 SURGICAL SUPPLY — 66 items
BANDAGE ACE 4X5 VEL STRL LF (GAUZE/BANDAGES/DRESSINGS) ×2 IMPLANT
BANDAGE ACE 6X5 VEL STRL LF (GAUZE/BANDAGES/DRESSINGS) IMPLANT
BANDAGE ESMARK 6X9 LF (GAUZE/BANDAGES/DRESSINGS) ×1 IMPLANT
BLADE SURG 15 STRL LF DISP TIS (BLADE) ×1 IMPLANT
BLADE SURG 15 STRL SS (BLADE) ×2
BNDG CMPR 9X6 STRL LF SNTH (GAUZE/BANDAGES/DRESSINGS) ×1
BNDG COHESIVE 4X5 TAN STRL (GAUZE/BANDAGES/DRESSINGS) ×2 IMPLANT
BNDG ESMARK 6X9 LF (GAUZE/BANDAGES/DRESSINGS) ×2
BRUSH SCRUB EZ PLAIN DRY (MISCELLANEOUS) IMPLANT
CANISTER SUCT 1200ML W/VALVE (MISCELLANEOUS) ×2 IMPLANT
CLSR STERI-STRIP ANTIMIC 1/2X4 (GAUZE/BANDAGES/DRESSINGS) IMPLANT
COVER BACK TABLE 60X90IN (DRAPES) ×2 IMPLANT
CUFF TOURNIQUET SINGLE 18IN (TOURNIQUET CUFF) IMPLANT
CUFF TOURNIQUET SINGLE 34IN LL (TOURNIQUET CUFF) ×1 IMPLANT
DECANTER SPIKE VIAL GLASS SM (MISCELLANEOUS) IMPLANT
DRAPE EXTREMITY T 121X128X90 (DRAPE) ×2 IMPLANT
DRAPE INCISE IOBAN 66X45 STRL (DRAPES) IMPLANT
DRAPE OEC MINIVIEW 54X84 (DRAPES) ×1 IMPLANT
DRAPE U 20/CS (DRAPES) ×2 IMPLANT
DRAPE U-SHAPE 47X51 STRL (DRAPES) ×2 IMPLANT
DRSG MEPILEX BORDER 4X8 (GAUZE/BANDAGES/DRESSINGS) ×1 IMPLANT
DRSG PAD ABDOMINAL 8X10 ST (GAUZE/BANDAGES/DRESSINGS) IMPLANT
DURAPREP 26ML APPLICATOR (WOUND CARE) ×1 IMPLANT
ELECT REM PT RETURN 9FT ADLT (ELECTROSURGICAL) ×2
ELECTRODE REM PT RTRN 9FT ADLT (ELECTROSURGICAL) ×1 IMPLANT
GAUZE SPONGE 4X4 12PLY STRL (GAUZE/BANDAGES/DRESSINGS) ×2 IMPLANT
GLOVE BIO SURGEON STRL SZ8 (GLOVE) ×2 IMPLANT
GLOVE BIO SURGEON STRL SZ8.5 (GLOVE) ×2 IMPLANT
GLOVE BIOGEL PI IND STRL 7.0 (GLOVE) IMPLANT
GLOVE BIOGEL PI IND STRL 8 (GLOVE) ×2 IMPLANT
GLOVE BIOGEL PI INDICATOR 7.0 (GLOVE) ×1
GLOVE BIOGEL PI INDICATOR 8 (GLOVE) ×2
GLOVE ECLIPSE 6.5 STRL STRAW (GLOVE) ×1 IMPLANT
GLOVE ORTHO TXT STRL SZ7.5 (GLOVE) ×2 IMPLANT
GOWN STRL REUS W/ TWL LRG LVL3 (GOWN DISPOSABLE) ×1 IMPLANT
GOWN STRL REUS W/ TWL XL LVL3 (GOWN DISPOSABLE) ×2 IMPLANT
GOWN STRL REUS W/TWL LRG LVL3 (GOWN DISPOSABLE) ×2
GOWN STRL REUS W/TWL XL LVL3 (GOWN DISPOSABLE) ×4
HANDPIECE INTERPULSE COAX TIP (DISPOSABLE)
NDL HYPO 25X1 1.5 SAFETY (NEEDLE) IMPLANT
NEEDLE HYPO 25X1 1.5 SAFETY (NEEDLE) IMPLANT
NS IRRIG 1000ML POUR BTL (IV SOLUTION) ×2 IMPLANT
PACK BASIN DAY SURGERY FS (CUSTOM PROCEDURE TRAY) ×2 IMPLANT
PAD CAST 4YDX4 CTTN HI CHSV (CAST SUPPLIES) ×1 IMPLANT
PADDING CAST COTTON 4X4 STRL (CAST SUPPLIES)
PENCIL BUTTON HOLSTER BLD 10FT (ELECTRODE) ×2 IMPLANT
SET HNDPC FAN SPRY TIP SCT (DISPOSABLE) IMPLANT
SLEEVE SCD COMPRESS KNEE MED (MISCELLANEOUS) ×1 IMPLANT
SPLINT FAST PLASTER 5X30 (CAST SUPPLIES)
SPLINT PLASTER CAST FAST 5X30 (CAST SUPPLIES) IMPLANT
SPONGE LAP 18X18 X RAY DECT (DISPOSABLE) ×1 IMPLANT
SPONGE LAP 4X18 X RAY DECT (DISPOSABLE) ×2 IMPLANT
STAPLER VISISTAT 35W (STAPLE) IMPLANT
SUT ETHILON 3 0 PS 1 (SUTURE) ×1 IMPLANT
SUT ETHILON 4 0 PS 2 18 (SUTURE) IMPLANT
SUT MNCRL AB 4-0 PS2 18 (SUTURE) IMPLANT
SUT VIC AB 0 CT1 27 (SUTURE)
SUT VIC AB 0 CT1 27XBRD ANBCTR (SUTURE) IMPLANT
SUT VIC AB 2-0 SH 18 (SUTURE) IMPLANT
SUT VIC AB 3-0 SH 27 (SUTURE)
SUT VIC AB 3-0 SH 27X BRD (SUTURE) IMPLANT
SUT VICRYL 3-0 CR8 SH (SUTURE) IMPLANT
SUT VICRYL 4-0 PS2 18IN ABS (SUTURE) IMPLANT
SWAB COLLECTION DEVICE MRSA (MISCELLANEOUS) ×1 IMPLANT
SWAB CULTURE ESWAB REG 1ML (MISCELLANEOUS) ×1 IMPLANT
SYR BULB 3OZ (MISCELLANEOUS) ×2 IMPLANT

## 2015-06-06 NOTE — Discharge Instructions (Signed)
Diet: As you were doing prior to hospitalization   Shower:  May shower and ok to let water run over the wounds,NO SOAKING IN TUB.  If the bandage gets wet, change with a clean dry gauze.   Dressing:  You may change your dressing tomorrow with dry gauze.  Change daily after that.   Activity:  Increase activity slowly as tolerated, but follow the weight bearing instructions below.  The rules on driving is that you can not be taking narcotics while you drive, and you must feel in control of the vehicle.    Weight Bearing:   As tolerated.  .    To prevent constipation: you may use a stool softener such as -  Colace (over the counter) 100 mg by mouth twice a day  Drink plenty of fluids (prune juice may be helpful) and high fiber foods Miralax (over the counter) for constipation as needed.    Itching:  If you experience itching with your medications, try taking only a single pain pill, or even half a pain pill at a time.  You may take up to 10 pain pills per day, and you can also use benadryl over the counter for itching or also to help with sleep.   Precautions:  If you experience chest pain or shortness of breath - call 911 immediately for transfer to the hospital emergency department!!  If you develop a fever greater that 101 F, purulent drainage from wound, increased redness or drainage from wound, or calf pain -- Call the office at [phone]                                                Follow- Up Appointment:  Please call for an [place] to [place] [phone]     Post Anesthesia Home Care Instructions  Activity: Get plenty of rest for the remainder of the day. A responsible adult should stay with you for 24 hours following the procedure.  For the next 24 hours, DO NOT: -Drive a car -Advertising copywriterperate machinery -Drink alcoholic beverages -Take any medication unless instructed by your physician -Make any legal decisions or sign important  papers.  Meals: Start with liquid foods such as gelatin or soup. Progress to regular foods as tolerated. Avoid greasy, spicy, heavy foods. If nausea and/or vomiting occur, drink only clear liquids until the nausea and/or vomiting subsides. Call your physician if vomiting continues.  Special Instructions/Symptoms: Your throat may feel dry or sore from the anesthesia or the breathing tube placed in your throat during surgery. If this causes discomfort, gargle with warm salt water. The discomfort should disappear within 24 hours.  If you had a scopolamine patch placed behind your ear for the management of post- operative nausea and/or vomiting:  1. The medication in the patch is effective for 72 hours, after which it should be removed.  Wrap patch in a tissue and discard in the trash. Wash hands thoroughly with soap and water. 2. You may remove the patch earlier than 72 hours if you experience unpleasant side effects which may include dry mouth, dizziness or visual disturbances. 3. Avoid touching the patch. Wash your hands with soap and water after contact with the patch.

## 2015-06-06 NOTE — OR Nursing (Signed)
Foreign body removed disposed of per Dr. Dion SaucierLandau
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
## 2015-06-06 NOTE — Anesthesia Procedure Notes (Signed)
Procedure Name: LMA Insertion Date/Time: 06/06/2015 10:51 AM Performed by: Zenia ResidesPAYNE, Kallum Jorgensen D Pre-anesthesia Checklist: Patient identified, Emergency Drugs available, Suction available and Patient being monitored Patient Re-evaluated:Patient Re-evaluated prior to inductionOxygen Delivery Method: Circle System Utilized Preoxygenation: Pre-oxygenation with 100% oxygen Intubation Type: IV induction Ventilation: Mask ventilation without difficulty LMA: LMA inserted LMA Size: 5.0 Number of attempts: 1 Airway Equipment and Method: Bite block Placement Confirmation: positive ETCO2 Tube secured with: Tape Dental Injury: Teeth and Oropharynx as per pre-operative assessment

## 2015-06-06 NOTE — Anesthesia Postprocedure Evaluation (Signed)
Anesthesia Post Note  Patient: Luis Pena  Procedure(s) Performed: Procedure(s) (LRB): LEFT LOWER LEG INCISION AND DRAINAGE  (Left) REMOVAL FOREIGN BODY  (Left)  Patient location during evaluation: PACU Anesthesia Type: General Level of consciousness: sedated Pain management: pain level controlled Vital Signs Assessment: post-procedure vital signs reviewed and stable Respiratory status: spontaneous breathing and respiratory function stable Cardiovascular status: stable Anesthetic complications: no    Last Vitals:  Filed Vitals:   06/06/15 1200 06/06/15 1215  BP: 120/82   Pulse: 74 64  Temp:    Resp: 12 14    Last Pain:  Filed Vitals:   06/06/15 1217  PainSc: 5                  Choya Tornow DANIEL
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
## 2015-06-06 NOTE — Transfer of Care (Signed)
Immediate Anesthesia Transfer of Care Note  Patient: Luis Pena  Procedure(s) Performed: Procedure(s): LEFT LOWER LEG INCISION AND DRAINAGE  (Left) REMOVAL FOREIGN BODY  (Left)  Patient Location: PACU  Anesthesia Type:General  Level of Consciousness: sedated  Airway & Oxygen Therapy: Patient Spontanous Breathing and Patient connected to face mask oxygen  Post-op Assessment: Report given to RN and Post -op Vital signs reviewed and stable  Post vital signs: Reviewed and stable  Last Vitals:  Filed Vitals:   06/06/15 0805 06/06/15 1135  BP: 118/69   Pulse: 83 67  Temp: 36.6 C   Resp: 20 17    Complications: No apparent anesthesia complications

## 2015-06-09 ENCOUNTER — Encounter (HOSPITAL_BASED_OUTPATIENT_CLINIC_OR_DEPARTMENT_OTHER): Payer: Self-pay | Admitting: Orthopedic Surgery

## 2015-06-09 LAB — WOUND CULTURE
Culture: NO GROWTH
Gram Stain: NONE SEEN

## 2015-06-11 LAB — ANAEROBIC CULTURE

## 2015-07-04 LAB — FUNGUS CULTURE W SMEAR: FUNGAL SMEAR: NONE SEEN

## 2015-07-20 LAB — AFB CULTURE WITH SMEAR (NOT AT ARMC): ACID FAST SMEAR: NONE SEEN

## 2015-08-20 ENCOUNTER — Emergency Department (HOSPITAL_COMMUNITY)
Admission: EM | Admit: 2015-08-20 | Discharge: 2015-08-20 | Disposition: A | Discharge status: Home / Self Care | Payer: BLUE CROSS/BLUE SHIELD | Attending: Emergency Medicine | Admitting: Emergency Medicine

## 2015-08-20 ENCOUNTER — Encounter (HOSPITAL_COMMUNITY): Payer: Self-pay | Admitting: Emergency Medicine

## 2015-08-20 DIAGNOSIS — Z792 Long term (current) use of antibiotics: Secondary | ICD-10-CM | POA: Diagnosis not present

## 2015-08-20 DIAGNOSIS — Z872 Personal history of diseases of the skin and subcutaneous tissue: Secondary | ICD-10-CM | POA: Diagnosis not present

## 2015-08-20 DIAGNOSIS — J321 Chronic frontal sinusitis: Secondary | ICD-10-CM | POA: Insufficient documentation

## 2015-08-20 DIAGNOSIS — Z8614 Personal history of Methicillin resistant Staphylococcus aureus infection: Secondary | ICD-10-CM | POA: Diagnosis not present

## 2015-08-20 DIAGNOSIS — J029 Acute pharyngitis, unspecified: Secondary | ICD-10-CM | POA: Diagnosis present

## 2015-08-20 DIAGNOSIS — F17210 Nicotine dependence, cigarettes, uncomplicated: Secondary | ICD-10-CM | POA: Diagnosis not present

## 2015-08-20 DIAGNOSIS — R61 Generalized hyperhidrosis: Secondary | ICD-10-CM | POA: Diagnosis not present

## 2015-08-20 DIAGNOSIS — Z87828 Personal history of other (healed) physical injury and trauma: Secondary | ICD-10-CM | POA: Insufficient documentation

## 2015-08-20 MED ORDER — BENZONATATE 100 MG PO CAPS: 100.0000 mg | ORAL_CAPSULE | Freq: Three times a day (TID) | ORAL | Status: DC

## 2015-08-20 MED ORDER — AMOXICILLIN 500 MG PO CAPS: 1000.0000 mg | ORAL_CAPSULE | Freq: Two times a day (BID) | ORAL | Status: DC

## 2015-08-20 MED ORDER — ONDANSETRON HCL 4 MG PO TABS: 4.0000 mg | ORAL_TABLET | Freq: Four times a day (QID) | ORAL | Status: DC

## 2015-08-20 NOTE — Discharge Instructions (Signed)
Sinusitis, Adult °Sinusitis is redness, soreness, and inflammation of the paranasal sinuses. Paranasal sinuses are air pockets within the bones of your face. They are located beneath your eyes, in the middle of your forehead, and above your eyes. In healthy paranasal sinuses, mucus is able to drain out, and air is able to circulate through them by way of your nose. However, when your paranasal sinuses are inflamed, mucus and air can become trapped. This can allow bacteria and other germs to grow and cause infection. °Sinusitis can develop quickly and last only a short time (acute) or continue over a long period (chronic). Sinusitis that lasts for more than 12 weeks is considered chronic. °CAUSES °Causes of sinusitis include: °· Allergies. °· Structural abnormalities, such as displacement of the cartilage that separates your nostrils (deviated septum), which can decrease the air flow through your nose and sinuses and affect sinus drainage. °· Functional abnormalities, such as when the small hairs (cilia) that line your sinuses and help remove mucus do not work properly or are not present. °SIGNS AND SYMPTOMS °Symptoms of acute and chronic sinusitis are the same. The primary symptoms are pain and pressure around the affected sinuses. Other symptoms include: °· Upper toothache. °· Earache. °· Headache. °· Bad breath. °· Decreased sense of smell and taste. °· A cough, which worsens when you are lying flat. °· Fatigue. °· Fever. °· Thick drainage from your nose, which often is green and may contain pus (purulent). °· Swelling and warmth over the affected sinuses. °DIAGNOSIS °Your health care provider will perform a physical exam. During your exam, your health care provider may perform any of the following to help determine if you have acute sinusitis or chronic sinusitis: °· Look in your nose for signs of abnormal growths in your nostrils (nasal polyps). °· Tap over the affected sinus to check for signs of  infection. °· View the inside of your sinuses using an imaging device that has a light attached (endoscope). °If your health care provider suspects that you have chronic sinusitis, one or more of the following tests may be recommended: °· Allergy tests. °· Nasal culture. A sample of mucus is taken from your nose, sent to a lab, and screened for bacteria. °· Nasal cytology. A sample of mucus is taken from your nose and examined by your health care provider to determine if your sinusitis is related to an allergy. °TREATMENT °Most cases of acute sinusitis are related to a viral infection and will resolve on their own within 10 days. Sometimes, medicines are prescribed to help relieve symptoms of both acute and chronic sinusitis. These may include pain medicines, decongestants, nasal steroid sprays, or saline sprays. °However, for sinusitis related to a bacterial infection, your health care provider will prescribe antibiotic medicines. These are medicines that will help kill the bacteria causing the infection. °Rarely, sinusitis is caused by a fungal infection. In these cases, your health care provider will prescribe antifungal medicine. °For some cases of chronic sinusitis, surgery is needed. Generally, these are cases in which sinusitis recurs more than 3 times per year, despite other treatments. °HOME CARE INSTRUCTIONS °· Drink plenty of water. Water helps thin the mucus so your sinuses can drain more easily. °· Use a humidifier. °· Inhale steam 3-4 times a day (for example, sit in the bathroom with the shower running). °· Apply a warm, moist washcloth to your face 3-4 times a day, or as directed by your health care provider. °· Use saline nasal sprays to help   moisten and clean your sinuses. °· Take medicines only as directed by your health care provider. °· If you were prescribed either an antibiotic or antifungal medicine, finish it all even if you start to feel better. °SEEK IMMEDIATE MEDICAL CARE IF: °· You have  increasing pain or severe headaches. °· You have nausea, vomiting, or drowsiness. °· You have swelling around your face. °· You have vision problems. °· You have a stiff neck. °· You have difficulty breathing. °  °This information is not intended to replace advice given to you by your health care provider. Make sure you discuss any questions you have with your health care provider. °  °Document Released: 05/10/2005 Document Revised: 05/31/2014 Document Reviewed: 05/25/2011 °Elsevier Interactive Patient Education ©2016 [employer] ° °Sore Throat °A sore throat is pain, burning, irritation, or scratchiness of the throat. There is often pain or tenderness when swallowing or talking. A sore throat may be accompanied by other symptoms, such as coughing, sneezing, fever, and swollen neck glands. A sore throat is often the first sign of another sickness, such as a cold, flu, strep throat, or mononucleosis (commonly known as mono). Most sore throats go away without medical treatment. °CAUSES  °The most common causes of a sore throat include: °· A viral infection, such as a cold, flu, or mono. °· A bacterial infection, such as strep throat, tonsillitis, or whooping cough. °· Seasonal allergies. °· Dryness in the air. °· Irritants, such as smoke or pollution. °· Gastroesophageal reflux disease (GERD). °HOME CARE INSTRUCTIONS  °· Only take over-the-counter medicines as directed by your caregiver. °· Drink enough fluids to keep your urine clear or pale yellow. °· Rest as needed. °· Try using throat sprays, lozenges, or sucking on hard candy to ease any pain (if older than 4 years or as directed). °· Sip warm liquids, such as broth, herbal tea, or warm water with honey to relieve pain temporarily. You may also eat or drink cold or frozen liquids such as frozen ice pops. °· Gargle with salt water (mix 1 tsp salt with 8 oz of water). °· Do not smoke and avoid secondhand smoke. °· Put a cool-mist humidifier in your bedroom at  night to moisten the air. You can also turn on a hot shower and sit in the bathroom with the door closed for 5-10 minutes. °SEEK IMMEDIATE MEDICAL CARE IF: °· You have difficulty breathing. °· You are unable to swallow fluids, soft foods, or your saliva. °· You have increased swelling in the throat. °· Your sore throat does not get better in 7 days. °· You have nausea and vomiting. °· You have a fever or persistent symptoms for more than 2-3 days. °· You have a fever and your symptoms suddenly get worse. °MAKE SURE YOU:  °· Understand these instructions. °· Will watch your condition. °· Will get help right away if you are not doing well or get worse. °  °This information is not intended to replace advice given to you by your health care provider. Make sure you discuss any questions you have with your health care provider. °  °Document Released: 06/17/2004 Document Revised: 05/31/2014 Document Reviewed: 01/16/2012 °Elsevier Interactive Patient Education ©2016 [employer] ° °

## 2015-08-20 NOTE — ED Notes (Signed)
PT DISCHARGED. INSTRUCTIONS AND PRESCRIPTIONS GIVEN. AAOX3. PT IN NO APPARENT DISTRESS. THE OPPORTUNITY TO ASK QUESTIONS WAS PROVIDED. 

## 2015-08-20 NOTE — ED Notes (Signed)
Pt states that he has had generalized body aches, sore throat and cough x 3 days. Alert and oriented.

## 2015-08-20 NOTE — ED Provider Notes (Signed)
[name]: [id number]     Arrival date & time 08/20/15  2013 History  By signing my name below, I, Gonzella LexKimberly Bianca Gray, attest that this documentation has been prepared under the direction and in the presence of Elpidio AnisShari Ozelle Brubacher, PA-C. Electronically Signed: Gonzella LexKimberly Bianca Gray, Scribe. 08/20/2015. 10:38 PM.  Chief Complaint  Patient presents with  . Generalized Body Aches  . Sore Throat   The history is provided by the patient. No language interpreter was used.   HPI Comments: Luis Pena is a 40 y.o. male who presents to the Emergency Department complaining of sudden onset of constant, mild generalized body aches and intermittent, productive cough for the past three days. Pt also reports associated sore throat, which began after onset of cough, diaphoresis, left-sided, throbbing, parietal HA with associated dizziness when standing, intermittent congestion in his head and chest, nausea, diarrhea, and two or three episodes of emesis today. Pt denies fever and recent sick contacts. He notes that he smokes but is otherwise healthy. Pt has NKDA.   Past Medical History  Diagnosis Date  . Abscess of tendon sheath of left lower leg 05/2015  . Foreign body of left lower leg 05/2015  . History of MRSA infection 2016    scrotum  . Foreign body of leg, left, superficial, infected 06/06/2015   Past Surgical History  Procedure Laterality Date  . Open reduction internal fixation (orif) hand Right 2010  . Hand hardware removal Right   . Incision and drainage abscess Left 06/06/2015    Procedure: LEFT LOWER LEG INCISION AND DRAINAGE ;  Surgeon: Teryl LucyJoshua Landau, MD;  Location: [facility];  Service: Orthopedics;  Laterality: Left;  . Foreign body removal Left 06/06/2015    Procedure: REMOVAL FOREIGN BODY ;  Surgeon: Teryl LucyJoshua Landau, MD;  Location:  SURGERY CENTER;  Service: Orthopedics;  Laterality: Left;   History reviewed. No pertinent family history. Social History  Substance Use  Topics  . Smoking status: Current Every Day Smoker -- 0.00 packs/day for 2 years    Types: Cigarettes  . Smokeless tobacco: Never Used     Comment: 3 cig./day  . Alcohol Use: Yes     Comment: rarely    Review of Systems  Constitutional: Positive for diaphoresis. Negative for fever.  HENT: Positive for congestion and sore throat.   Respiratory: Positive for cough.   Gastrointestinal: Positive for nausea, vomiting and diarrhea.  Musculoskeletal: Positive for myalgias.  Neurological: Positive for dizziness and headaches.   Allergies  Shellfish allergy  Home Medications   Prior to Admission medications   Medication Sig Start Date End Date Taking? Authorizing Provider  doxycycline (VIBRAMYCIN) 50 MG capsule Take 1 capsule (50 mg total) by mouth 2 (two) times daily. 06/06/15   Teryl LucyJoshua Landau, MD  HYDROcodone-acetaminophen (NORCO) 5-325 MG tablet Take 1-2 tablets by mouth every 6 (six) hours as needed for moderate pain. MAXIMUM TOTAL ACETAMINOPHEN DOSE IS 4000 MG PER DAY 06/06/15   Teryl LucyJoshua Landau, MD  sulfamethoxazole-trimethoprim (BACTRIM DS,SEPTRA DS) 800-160 MG tablet Take 1 tablet by mouth 2 (two) times daily. 06/06/15   Teryl LucyJoshua Landau, MD   BP 122/83 mmHg  Pulse 103  Temp(Src) 98.6 F (37 C) (Oral)  Resp 20  Ht 5\' 9"  (1.753 m)  Wt 227 lb (102.967 kg)  BMI 33.51 kg/m2  SpO2 96% Physical Exam  Constitutional: He is oriented to person, place, and time. He appears well-developed and well-nourished.  HENT:  Head: Normocephalic.  Right Ear: External ear normal.  Left Ear: External ear normal.  Nose: Mucosal edema present. Right sinus exhibits frontal sinus tenderness. Left sinus exhibits frontal sinus tenderness.  Mouth/Throat: Oropharynx is clear and moist.  Neck: Normal range of motion. Neck supple.  Cardiovascular: Normal rate and normal heart sounds.   No murmur heard. Pulmonary/Chest: Effort normal and breath sounds normal. He has no wheezes. He has no rales.  Abdominal: Soft.  Bowel sounds are normal. He exhibits no distension. There is no tenderness.  Musculoskeletal: Normal range of motion.  Lymphadenopathy:    He has no cervical adenopathy.  Neurological: He is oriented to person, place, and time.  Skin: Skin is warm and dry. No pallor.   ED Course  Procedures  DIAGNOSTIC STUDIES:    Oxygen Saturation is 96% on RA, adequate by my interpretation.   COORDINATION OF CARE:  10:36 PM Will prescribe pt antibiotic and will write a work note.Discussed treatment plan with pt at bedside and pt agreed to plan.  MDM   Final diagnoses:  None    1. Acute frontal sinusitis  The patient exhibits symptoms and signs of sinusitis including dizziness, and sinus tenderness. Will prescribe abx and supportive care measures. Encourage smoking cessation.  I personally performed the services described in this documentation, which was scribed in my presence. The recorded information has been reviewed and is accurate.     [name], PA-C 08/22/15 1610  Gilda Crease, MD 08/22/15 [phone]

## 2016-02-23 ENCOUNTER — Emergency Department (HOSPITAL_COMMUNITY): Payer: BLUE CROSS/BLUE SHIELD

## 2016-02-23 ENCOUNTER — Emergency Department (HOSPITAL_COMMUNITY)
Admission: EM | Admit: 2016-02-23 | Discharge: 2016-02-24 | Disposition: A | Discharge status: Home / Self Care | Payer: BLUE CROSS/BLUE SHIELD | Attending: Emergency Medicine | Admitting: Emergency Medicine

## 2016-02-23 DIAGNOSIS — R51 Headache: Secondary | ICD-10-CM | POA: Insufficient documentation

## 2016-02-23 DIAGNOSIS — F17210 Nicotine dependence, cigarettes, uncomplicated: Secondary | ICD-10-CM | POA: Diagnosis not present

## 2016-02-23 DIAGNOSIS — Z79899 Other long term (current) drug therapy: Secondary | ICD-10-CM | POA: Insufficient documentation

## 2016-02-23 DIAGNOSIS — R519 Headache, unspecified: Secondary | ICD-10-CM

## 2016-02-23 MED ORDER — SODIUM CHLORIDE 0.9 % IV BOLUS (SEPSIS)
1000.0000 mL | Freq: Once | INTRAVENOUS | Status: AC
  Administered 2016-02-24: 1000 mL via INTRAVENOUS

## 2016-02-23 MED ORDER — METOCLOPRAMIDE HCL 5 MG/ML IJ SOLN
10.0000 mg | INTRAMUSCULAR | Status: AC
Start: 2016-02-23 — End: 2016-02-24
  Administered 2016-02-24: 10 mg via INTRAVENOUS
  Filled 2016-02-23: qty 2

## 2016-02-23 MED ORDER — DIPHENHYDRAMINE HCL 50 MG/ML IJ SOLN
25.0000 mg | Freq: Once | INTRAMUSCULAR | Status: AC
  Administered 2016-02-24: 25 mg via INTRAVENOUS
  Filled 2016-02-23: qty 1

## 2016-02-23 MED ORDER — KETOROLAC TROMETHAMINE 30 MG/ML IJ SOLN
30.0000 mg | Freq: Once | INTRAMUSCULAR | Status: AC
  Administered 2016-02-24: 30 mg via INTRAVENOUS
  Filled 2016-02-23: qty 1

## 2016-02-23 NOTE — ED Triage Notes (Signed)
Pt states that he has had a headache with light sensitivity x 4-5 days. OTC meds not working. Neuro intact. Alert and oriented.

## 2016-02-23 NOTE — ED Provider Notes (Signed)
[facility]-EMERGENCY DEPT Provider Note   [name]: [id number] Arrival date & time: 02/23/16  2104 By signing my name below, I, Levon Hedger, attest that this documentation has been prepared under the direction and in the presence of non-physician practitioner, Antony Madura, PA-C Electronically Signed: Levon Hedger, Scribe. 02/23/2016. 11:24 PM.   History   Chief Complaint Chief Complaint  Patient presents with  . Headache   HPI Luis Pena is a 40 y.o. male who presents to the Emergency Department complaining of constant frontal headache onset 4-5 days ago. He describes the pain as pressure and intermittently throbbing. He has taken motrin, tylenol, and Excedrin migraine with little relief. Pt notes associated photophobia and numbness in his right hip, and tingling to the area. Pt has no hx of migraines. He has never experienced this pain before. He denies recent any heavy lifting. Pt denies any nausea, vomiting, vision change, vision loss, tinnitus, and hearing loss.   The history is provided by the patient. No language interpreter was used.   Past Medical History:  Diagnosis Date  . Abscess of tendon sheath of left lower leg 05/2015  . Foreign body of left lower leg 05/2015  . Foreign body of leg, left, superficial, infected 06/06/2015  . History of MRSA infection 2016   scrotum    Patient Active Problem List   Diagnosis Date Noted  . Foreign body of leg, left, superficial, infected 06/06/2015  . Cellulitis of right lower extremity 01/23/2014    Past Surgical History:  Procedure Laterality Date  . FOREIGN BODY REMOVAL Left 06/06/2015   Procedure: REMOVAL FOREIGN BODY ;  Surgeon: Teryl Lucy, MD;  Location: [facility];  Service: Orthopedics;  Laterality: Left;  . HAND HARDWARE REMOVAL Right   . INCISION AND DRAINAGE ABSCESS Left 06/06/2015   Procedure: LEFT LOWER LEG INCISION AND DRAINAGE ;  Surgeon: Teryl Lucy, MD;  Location: [facility];   Service: Orthopedics;  Laterality: Left;  . OPEN REDUCTION INTERNAL FIXATION (ORIF) HAND Right 2010    Home Medications    Prior to Admission medications   Medication Sig Start Date End Date Taking? Authorizing Provider  acetaminophen (TYLENOL) 500 MG tablet Take 1,000 mg by mouth every 6 (six) hours as needed for mild pain.   Yes Historical Provider, MD  aspirin-acetaminophen-caffeine (EXCEDRIN MIGRAINE) [phone] MG tablet Take 1 tablet by mouth every 6 (six) hours as needed for headache.   Yes Historical Provider, MD  ibuprofen (ADVIL,MOTRIN) 200 MG tablet Take 400 mg by mouth every 6 (six) hours as needed for moderate pain.   Yes Historical Provider, MD  amoxicillin (AMOXIL) 500 MG capsule Take 2 capsules (1,000 mg total) by mouth 2 (two) times daily. Patient not taking: Reported on 02/24/2016 08/20/15   [name], PA-C  benzonatate (TESSALON) 100 MG capsule Take 1 capsule (100 mg total) by mouth every 8 (eight) hours. Patient not taking: Reported on 02/24/2016 08/20/15   [name], PA-C  butalbital-acetaminophen-caffeine (FIORICET, ESGIC) [phone] MG tablet Take 1-2 tablets by mouth every 8 (eight) hours as needed for headache. 02/24/16 02/23/17  Antony Madura, PA-C  doxycycline (VIBRAMYCIN) 50 MG capsule Take 1 capsule (50 mg total) by mouth 2 (two) times daily. Patient not taking: Reported on 02/24/2016 06/06/15   Teryl Lucy, MD  HYDROcodone-acetaminophen [facility]) 5-325 MG tablet Take 1-2 tablets by mouth every 6 (six) hours as needed for moderate pain. MAXIMUM TOTAL ACETAMINOPHEN DOSE IS 4000 MG PER DAY Patient not taking: Reported on 02/24/2016 06/06/15  Teryl LucyJoshua Landau, MD  ondansetron (ZOFRAN) 4 MG tablet Take 1 tablet (4 mg total) by mouth every 6 (six) hours. Patient not taking: Reported on 02/24/2016 08/20/15   Elpidio AnisShari Upstill, PA-C  sulfamethoxazole-trimethoprim (BACTRIM DS,SEPTRA DS) 800-160 MG tablet Take 1 tablet by mouth 2 (two) times daily. Patient not taking: Reported on  02/24/2016 06/06/15   Teryl LucyJoshua Landau, MD    Family History No family history on file.  Social History Social History  Substance Use Topics  . Smoking status: Current Every Day Smoker    Packs/day: 0.00    Years: 2.00    Types: Cigarettes  . Smokeless tobacco: Never Used     Comment: 3 cig./day  . Alcohol use Yes     Comment: rarely     Allergies   Shellfish allergy   Review of Systems Review of Systems 10 systems reviewed and all are negative for acute change except as noted in the HPI.    Physical Exam Updated Vital Signs BP 132/88 (BP Location: Left Arm)   Pulse 81   Temp 98.2 F (36.8 C) (Oral)   Resp 18   SpO2 98%   Physical Exam  Constitutional: He is oriented to person, place, and time. He appears well-developed and well-nourished. No distress.  Nontoxic appearing and in no distress.  HENT:  Head: Normocephalic and atraumatic.  Mouth/Throat: Oropharynx is clear and moist.  Symmetric rise of the uvula with phonation  Eyes: Conjunctivae and EOM are normal. Pupils are equal, round, and reactive to light. No scleral icterus.  Neck: Normal range of motion.  No nuchal rigidity or meningismus  Cardiovascular: Normal rate, regular rhythm and intact distal pulses.   Pulmonary/Chest: Effort normal. No respiratory distress.  Respirations even and unlabored.  Musculoskeletal: Normal range of motion.  Neurological: He is alert and oriented to person, place, and time. No cranial nerve deficit. He exhibits normal muscle tone. Coordination normal.  GCS 15.  Speech is goal oriented.  No cranial nerve deficits appreciated; symmetric eyebrow raise, no facial drooping, tongue midline.  Patient has equal grip strength bilaterally at 5/5 strength against resistance in all major muscle groups bilaterally.  Sensation to light touch intact.  Patient moves extremities without ataxia.  Skin: Skin is warm and dry. No rash noted. He is not diaphoretic. No erythema. No pallor.    Psychiatric: He has a normal mood and affect. His behavior is normal.  Nursing note and vitals reviewed.   ED Treatments / Results  DIAGNOSTIC STUDIES:  Oxygen Saturation is 94% on RA, adequate by my interpretation.    COORDINATION OF CARE:  11:23 PM Will order CT head. Discussed treatment plan with pt at bedside and pt agreed to plan.   Labs (all labs ordered are listed, but only abnormal results are displayed) Labs Reviewed - No data to display  EKG  EKG Interpretation None       Radiology Ct Head Wo Contrast  Result Date: 02/24/2016 CLINICAL DATA:  40 year old male with frontal headache. EXAM: CT HEAD WITHOUT CONTRAST TECHNIQUE: Contiguous axial images were obtained from the base of the skull through the vertex without intravenous contrast. COMPARISON:  Head CT dated 09/15/2009 FINDINGS: Brain: No evidence of acute infarction, hemorrhage, hydrocephalus, extra-axial collection or mass lesion/mass effect. Vascular: No hyperdense vessel or unexpected calcification. Skull: Normal. Negative for fracture or focal lesion. Sinuses/Orbits: No acute finding. Other: None IMPRESSION: No acute intracranial pathology. Electronically Signed   By: Elgie CollardArash  Radparvar M.D.   On: 02/24/2016 01:39  Procedures Procedures (including critical care time)  Medications Ordered in ED Medications  sodium chloride 0.9 % bolus 1,000 mL (1,000 mLs Intravenous New Bag/Given 02/24/16 0018)  ketorolac (TORADOL) 30 MG/ML injection 30 mg (30 mg Intravenous Given 02/24/16 0019)  metoCLOPramide (REGLAN) injection 10 mg (10 mg Intravenous Given 02/24/16 0019)  diphenhydrAMINE (BENADRYL) injection 25 mg (25 mg Intravenous Given 02/24/16 0019)    Initial Impression / Assessment and Plan / ED Course  I have reviewed the triage vital signs and the nursing notes.  Pertinent labs & imaging results that were available during my care of the patient were reviewed by me and considered in my medical decision making (see  chart for details).  Clinical Course    40 year old male presents to the emergency department for evaluation of headache.  He has a nonfocal neurologic exam.  No fever, nuchal rigidity, or meningismus to suggest meningitis.  Head CT ordered as patient with no history of similar symptoms.  CT negative for mass, hemorrhage, hydrocephalus, or other acute pathology.  Headache has resolved with migraine cocktail.  Doubt emergent cause of symptoms.  Will discharge with instructions for supportive care.  Return precautions discussed and provided.  Patient discharged in stable condition with no unaddressed concerns.   Final Clinical Impressions(s) / ED Diagnoses   Final diagnoses:  Bad headache    New Prescriptions New Prescriptions   BUTALBITAL-ACETAMINOPHEN-CAFFEINE (FIORICET, ESGIC) 50-325-40 MG TABLET    Take 1-2 tablets by mouth every 8 (eight) hours as needed for headache.    I personally performed the services described in this documentation, which was scribed in my presence. The recorded information has been reviewed and is accurate.      Antony Madura, PA-C 02/24/16 0155    Tilden Fossa, MD 02/24/16 Zollie Pee

## 2016-02-24 ENCOUNTER — Encounter (HOSPITAL_COMMUNITY): Payer: Self-pay | Admitting: Radiology

## 2016-02-24 MED ORDER — BUTALBITAL-APAP-CAFFEINE 50-325-40 MG PO TABS: 1.0000 | ORAL_TABLET | Freq: Three times a day (TID) | ORAL | 0 refills | Status: AC | PRN

## 2016-04-16 ENCOUNTER — Emergency Department (HOSPITAL_COMMUNITY)
Admission: EM | Admit: 2016-04-16 | Discharge: 2016-04-16 | Disposition: A | Discharge status: Home / Self Care | Payer: BLUE CROSS/BLUE SHIELD | Attending: Emergency Medicine | Admitting: Emergency Medicine

## 2016-04-16 ENCOUNTER — Emergency Department (HOSPITAL_COMMUNITY): Payer: BLUE CROSS/BLUE SHIELD

## 2016-04-16 ENCOUNTER — Encounter (HOSPITAL_COMMUNITY): Payer: Self-pay

## 2016-04-16 DIAGNOSIS — M545 Low back pain, unspecified: Secondary | ICD-10-CM

## 2016-04-16 DIAGNOSIS — Z79899 Other long term (current) drug therapy: Secondary | ICD-10-CM | POA: Diagnosis not present

## 2016-04-16 DIAGNOSIS — M533 Sacrococcygeal disorders, not elsewhere classified: Secondary | ICD-10-CM | POA: Insufficient documentation

## 2016-04-16 DIAGNOSIS — R102 Pelvic and perineal pain: Secondary | ICD-10-CM | POA: Insufficient documentation

## 2016-04-16 DIAGNOSIS — Z7982 Long term (current) use of aspirin: Secondary | ICD-10-CM | POA: Insufficient documentation

## 2016-04-16 DIAGNOSIS — F17210 Nicotine dependence, cigarettes, uncomplicated: Secondary | ICD-10-CM | POA: Insufficient documentation

## 2016-04-16 LAB — CBC WITH DIFFERENTIAL/PLATELET
BASOS PCT: 1 %
Basophils Absolute: 0 10*3/uL (ref 0.0–0.1)
EOS ABS: 0.4 10*3/uL (ref 0.0–0.7)
Eosinophils Relative: 5 %
HCT: 42.2 % (ref 39.0–52.0)
HEMOGLOBIN: 14.1 g/dL (ref 13.0–17.0)
Lymphocytes Relative: 40 %
Lymphs Abs: 3.3 10*3/uL (ref 0.7–4.0)
MCH: 28.6 pg (ref 26.0–34.0)
MCHC: 33.4 g/dL (ref 30.0–36.0)
MCV: 85.6 fL (ref 78.0–100.0)
MONOS PCT: 8 %
Monocytes Absolute: 0.7 10*3/uL (ref 0.1–1.0)
NEUTROS PCT: 46 %
Neutro Abs: 3.8 10*3/uL (ref 1.7–7.7)
PLATELETS: 253 10*3/uL (ref 150–400)
RBC: 4.93 MIL/uL (ref 4.22–5.81)
RDW: 13.3 % (ref 11.5–15.5)
WBC: 8.2 10*3/uL (ref 4.0–10.5)

## 2016-04-16 LAB — BASIC METABOLIC PANEL
Anion gap: 4 — ABNORMAL LOW (ref 5–15)
BUN: 16 mg/dL (ref 6–20)
CALCIUM: 9.2 mg/dL (ref 8.9–10.3)
CO2: 26 mmol/L (ref 22–32)
CREATININE: 1.17 mg/dL (ref 0.61–1.24)
Chloride: 108 mmol/L (ref 101–111)
GFR calc non Af Amer: >60 mL/min (ref >60)
Glucose, Bld: 107 mg/dL — ABNORMAL HIGH (ref 65–99)
Potassium: 4.2 mmol/L (ref 3.5–5.1)
SODIUM: 138 mmol/L (ref 135–145)

## 2016-04-16 MED ORDER — SODIUM CHLORIDE 0.9 % IJ SOLN
INTRAMUSCULAR | Status: AC
  Filled 2016-04-16: qty 50

## 2016-04-16 MED ORDER — MORPHINE SULFATE (PF) 4 MG/ML IV SOLN
4.0000 mg | Freq: Once | INTRAVENOUS | Status: AC
  Administered 2016-04-16: 4 mg via INTRAVENOUS
  Filled 2016-04-16: qty 1

## 2016-04-16 MED ORDER — OXYCODONE-ACETAMINOPHEN 5-325 MG PO TABS
1.0000 | ORAL_TABLET | Freq: Once | ORAL | Status: AC
  Administered 2016-04-16: 1 via ORAL
  Filled 2016-04-16: qty 1

## 2016-04-16 MED ORDER — OXYCODONE-ACETAMINOPHEN 5-325 MG PO TABS: 1.0000 | ORAL_TABLET | ORAL | 0 refills | Status: DC | PRN

## 2016-04-16 MED ORDER — IOPAMIDOL (ISOVUE-300) INJECTION 61%
INTRAVENOUS | Status: AC
  Filled 2016-04-16: qty 100

## 2016-04-16 MED ORDER — PREDNISONE 10 MG PO TABS: 20.0000 mg | ORAL_TABLET | Freq: Two times a day (BID) | ORAL | 0 refills | Status: DC

## 2016-04-16 MED ORDER — IOPAMIDOL (ISOVUE-300) INJECTION 61%
100.0000 mL | Freq: Once | INTRAVENOUS | Status: AC | PRN
  Administered 2016-04-16: 100 mL via INTRAVENOUS

## 2016-04-16 NOTE — Discharge Instructions (Signed)
Use heat on the sore area 3 or 4 times a day.  Do not drive or work when taking the narcotic pain reliever.

## 2016-04-16 NOTE — ED Provider Notes (Signed)
[facility]-EMERGENCY DEPT Provider Note   [name]: [id number] Arrival date & time: 04/16/16  0430     History   Chief Complaint Chief Complaint  Patient presents with  . Back Pain    HPI Luis Pena is a 40 y.o. male.  He had onset last night of pain in the tailbone area. Pain is severe and he rates at 10/10. Is worse with movement and with palpation. There is no radiation of pain. He denies fever or chills. Denies any recent trauma or unusual activity. He is taken acetaminophen without relief. Pain is rated at 10/10.      Past Medical History:  Diagnosis Date  . Abscess of tendon sheath of left lower leg 05/2015  . Foreign body of left lower leg 05/2015  . Foreign body of leg, left, superficial, infected 06/06/2015  . History of MRSA infection 2016   scrotum    Patient Active Problem List   Diagnosis Date Noted  . Foreign body of leg, left, superficial, infected 06/06/2015  . Cellulitis of right lower extremity 01/23/2014    Past Surgical History:  Procedure Laterality Date  . FOREIGN BODY REMOVAL Left 06/06/2015   Procedure: REMOVAL FOREIGN BODY ;  Surgeon: Teryl LucyJoshua Landau, MD;  Location: [facility];  Service: Orthopedics;  Laterality: Left;  . HAND HARDWARE REMOVAL Right   . INCISION AND DRAINAGE ABSCESS Left 06/06/2015   Procedure: LEFT LOWER LEG INCISION AND DRAINAGE ;  Surgeon: Teryl LucyJoshua Landau, MD;  Location: [facility];  Service: Orthopedics;  Laterality: Left;  . OPEN REDUCTION INTERNAL FIXATION (ORIF) HAND Right 2010       Home Medications    Prior to Admission medications   Medication Sig Start Date End Date Taking? Authorizing Provider  acetaminophen (TYLENOL) 500 MG tablet Take 1,000 mg by mouth every 6 (six) hours as needed for mild pain.    Historical Provider, MD  amoxicillin (AMOXIL) 500 MG capsule Take 2 capsules (1,000 mg total) by mouth 2 (two) times daily. Patient not taking: Reported on 02/24/2016 08/20/15   Elpidio AnisShari  Upstill, PA-C  aspirin-acetaminophen-caffeine (EXCEDRIN MIGRAINE) [id number] MG tablet Take 1 tablet by mouth every 6 (six) hours as needed for headache.    Historical Provider, MD  benzonatate (TESSALON) 100 MG capsule Take 1 capsule (100 mg total) by mouth every 8 (eight) hours. Patient not taking: Reported on 02/24/2016 08/20/15   Elpidio AnisShari Upstill, PA-C  butalbital-acetaminophen-caffeine (FIORICET, ESGIC) [phone] [id number] MG tablet Take 1-2 tablets by mouth every 8 (eight) hours as needed for headache. 02/24/16 02/23/17  Antony MaduraKelly Humes, PA-C  doxycycline (VIBRAMYCIN) 50 MG capsule Take 1 capsule (50 mg total) by mouth 2 (two) times daily. Patient not taking: Reported on 02/24/2016 06/06/15   Teryl LucyJoshua Landau, MD  HYDROcodone-acetaminophen [facility](NORCO) 5-325 MG tablet Take 1-2 tablets by mouth every 6 (six) hours as needed for moderate pain. MAXIMUM TOTAL ACETAMINOPHEN DOSE IS 4000 MG PER DAY Patient not taking: Reported on 02/24/2016 06/06/15   Teryl LucyJoshua Landau, MD  ibuprofen (ADVIL,MOTRIN) 200 MG tablet Take 400 mg by mouth every 6 (six) hours as needed for moderate pain.    Historical Provider, MD  ondansetron (ZOFRAN) 4 MG tablet Take 1 tablet (4 mg total) by mouth every 6 (six) hours. Patient not taking: Reported on 02/24/2016 08/20/15   Elpidio AnisShari Upstill, PA-C  sulfamethoxazole-trimethoprim (BACTRIM DS,SEPTRA DS) 800-160 MG tablet Take 1 tablet by mouth 2 (two) times daily. Patient not taking: Reported on 02/24/2016 06/06/15   Teryl LucyJoshua Landau, MD  Family History History reviewed. No pertinent family history.  Social History Social History  Substance Use Topics  . Smoking status: Current Every Day Smoker    Packs/day: 0.00    Years: 2.00    Types: Cigarettes  . Smokeless tobacco: Never Used     Comment: 3 cig./day  . Alcohol use Yes     Comment: rarely     Allergies   Shellfish allergy   Review of Systems Review of Systems  All other systems reviewed and are negative.    Physical Exam Updated Vital  Signs BP 129/87 (BP Location: Left Arm)   Pulse 93   Temp 98 F (36.7 C) (Oral)   Resp 20   SpO2 97%   Physical Exam  Nursing note and vitals reviewed.  40 year old male, appears uncomfortable, but is in no acute distress. Vital signs are normal. Oxygen saturation is 97%, which is normal. Head is normocephalic and atraumatic. PERRLA, EOMI. Oropharynx is clear. Neck is nontender and supple without adenopathy or JVD. Back is nontender and there is no CVA tenderness. There is marked tenderness in the midline just. To the anus. No definite mass or induration is palpable. No rashes seen there. Lungs are clear without rales, wheezes, or rhonchi. Chest is nontender. Heart has regular rate and rhythm without murmur. Abdomen is soft, flat, nontender without masses or hepatosplenomegaly and peristalsis is normoactive. Extremities have no cyanosis or edema, full range of motion is present. Skin is warm and dry without rash. Neurologic: Mental status is normal, cranial nerves are intact, there are no motor or sensory deficits.  ED Treatments / Results  Labs (all labs ordered are listed, but only abnormal results are displayed) Labs Reviewed  BASIC METABOLIC PANEL - Abnormal; Notable for the following:       Result Value   Glucose, Bld 107 (*)    Anion gap 4 (*)    All other components within normal limits  CBC WITH DIFFERENTIAL/PLATELET    Procedures Procedures (including critical care time)  Medications Ordered in ED Medications  iopamidol (ISOVUE-300) 61 % injection (not administered)  sodium chloride 0.9 % injection (not administered)  morphine 4 MG/ML injection 4 mg (4 mg Intravenous Given 04/16/16 0811)  iopamidol (ISOVUE-300) 61 % injection 100 mL (100 mLs Intravenous Contrast Given 04/16/16 0816)     Initial Impression / Assessment and Plan / ED Course  I have reviewed the triage vital signs and the nursing notes.  Pertinent labs & imaging results that were available  during my care of the patient were reviewed by me and considered in my medical decision making (see chart for details).  Clinical Course    Pain in the perianal area. He does have history of MRSA infections in the past. We'll send for CT to look for possible occult abscess. Old records are reviewed showing prior ED visits for abscess and cellulitis.  Initial lab work is reassuring. Normal WBC and differential. CT is pending. Case is signed out to Dr. Effie ShyWentz.  Final Clinical Impressions(s) / ED Diagnoses   Final diagnoses:  Coccydynia    New Prescriptions New Prescriptions   No medications on file     Dione Boozeavid Atlee Kluth, MD 04/16/16 [phone]

## 2016-04-16 NOTE — ED Provider Notes (Signed)
Reevaluation after imaging at the request of Dr. Preston FleetingGlick. Patient is more comfortable now, resting prone.  Patient evaluated briefly, he has pain in the upper intergluteal crease, without palpable or visible swelling. There is no palpable perianal tenderness or tenderness of the top of the gluteal crease. Patient has wife states that he occasionally has pain down his legs, and mild low back pain.  Findings discussed with patient, all questions answered.   Medical decision-making- nonspecific buttock pain, possibly related to spondylosis, and L5 disc. Doubt lumbar myelopathy.     Plan: Analgesia, anti-inflammatory with prednisone, heat and reevaluation by PCP if not better in 3 days. Work release for 5 days.    Mancel BaleElliott Janin Kozlowski, MD 04/16/16 [phone]

## 2016-04-16 NOTE — ED Notes (Signed)
Pt transported to CT ?

## 2016-04-16 NOTE — ED Triage Notes (Signed)
Pt complains of low back pain, no injury and no radiation

## 2016-07-09 ENCOUNTER — Emergency Department (HOSPITAL_COMMUNITY)
Admission: EM | Admit: 2016-07-09 | Discharge: 2016-07-10 | Disposition: A | Discharge status: Home / Self Care | Payer: BLUE CROSS/BLUE SHIELD | Attending: Emergency Medicine | Admitting: Emergency Medicine

## 2016-07-09 ENCOUNTER — Emergency Department (HOSPITAL_COMMUNITY): Payer: BLUE CROSS/BLUE SHIELD

## 2016-07-09 ENCOUNTER — Encounter (HOSPITAL_COMMUNITY): Payer: Self-pay | Admitting: Emergency Medicine

## 2016-07-09 DIAGNOSIS — Z79899 Other long term (current) drug therapy: Secondary | ICD-10-CM | POA: Diagnosis not present

## 2016-07-09 DIAGNOSIS — W228XXA Striking against or struck by other objects, initial encounter: Secondary | ICD-10-CM | POA: Diagnosis not present

## 2016-07-09 DIAGNOSIS — Y929 Unspecified place or not applicable: Secondary | ICD-10-CM | POA: Insufficient documentation

## 2016-07-09 DIAGNOSIS — S61411A Laceration without foreign body of right hand, initial encounter: Secondary | ICD-10-CM | POA: Insufficient documentation

## 2016-07-09 DIAGNOSIS — Z7982 Long term (current) use of aspirin: Secondary | ICD-10-CM | POA: Insufficient documentation

## 2016-07-09 DIAGNOSIS — Y999 Unspecified external cause status: Secondary | ICD-10-CM | POA: Diagnosis not present

## 2016-07-09 DIAGNOSIS — S65211A Laceration of superficial palmar arch of right hand, initial encounter: Secondary | ICD-10-CM

## 2016-07-09 DIAGNOSIS — F17210 Nicotine dependence, cigarettes, uncomplicated: Secondary | ICD-10-CM | POA: Insufficient documentation

## 2016-07-09 DIAGNOSIS — Y939 Activity, unspecified: Secondary | ICD-10-CM | POA: Insufficient documentation

## 2016-07-09 NOTE — ED Triage Notes (Signed)
Pt reports that he was hitting a speed boxing bag and accidentally hit the machine next to it with his right hand. Pt reports pain and swelling to right and with pain extending into wrist. 1cm laceration noted to posterior hand in between 4th and 5th digit.

## 2016-07-10 NOTE — ED Notes (Signed)
Awaiting evaluation by provider.  

## 2016-07-10 NOTE — ED Provider Notes (Signed)
[facility]-EMERGENCY DEPT Provider Note   [name]: [id number] Arrival date & time: 07/09/16  2200     History   Chief Complaint Chief Complaint  Patient presents with  . Hand Injury    HPI Luis Pena is a 41 y.o. male.  He was hitting a punching bag and missed and struck a metal object next to it suffering a laceration to his right hand. He denies other injury. Less than this immunization was within the past year.   The history is provided by the patient.    Past Medical History:  Diagnosis Date  . Abscess of tendon sheath of left lower leg 05/2015  . Foreign body of left lower leg 05/2015  . Foreign body of leg, left, superficial, infected 06/06/2015  . History of MRSA infection 2016   scrotum    Patient Active Problem List   Diagnosis Date Noted  . Foreign body of leg, left, superficial, infected 06/06/2015  . Cellulitis of right lower extremity 01/23/2014    Past Surgical History:  Procedure Laterality Date  . FOREIGN BODY REMOVAL Left 06/06/2015   Procedure: REMOVAL FOREIGN BODY ;  Surgeon: Teryl LucyJoshua Landau, MD;  Location: [facility];  Service: Orthopedics;  Laterality: Left;  . HAND HARDWARE REMOVAL Right   . INCISION AND DRAINAGE ABSCESS Left 06/06/2015   Procedure: LEFT LOWER LEG INCISION AND DRAINAGE ;  Surgeon: Teryl LucyJoshua Landau, MD;  Location: [facility];  Service: Orthopedics;  Laterality: Left;  . OPEN REDUCTION INTERNAL FIXATION (ORIF) HAND Right 2010       Home Medications    Prior to Admission medications   Medication Sig Start Date End Date Taking? Authorizing Provider  acetaminophen (TYLENOL) 500 MG tablet Take 1,000 mg by mouth every 6 (six) hours as needed for mild pain.    Historical Provider, MD  aspirin-acetaminophen-caffeine (EXCEDRIN MIGRAINE) [phone] MG tablet Take 1 tablet by mouth every 6 (six) hours as needed for headache.    Historical Provider, MD  butalbital-acetaminophen-caffeine (FIORICET, ESGIC) 50-325-40  MG tablet Take 1-2 tablets by mouth every 8 (eight) hours as needed for headache. 02/24/16 02/23/17  Antony MaduraKelly Humes, PA-C  ibuprofen (ADVIL,MOTRIN) 200 MG tablet Take 400 mg by mouth every 6 (six) hours as needed for moderate pain.    Historical Provider, MD    Family History History reviewed. No pertinent family history.  Social History Social History  Substance Use Topics  . Smoking status: Current Every Day Smoker    Packs/day: 0.00    Years: 2.00    Types: Cigarettes  . Smokeless tobacco: Never Used     Comment: 3 cig./day  . Alcohol use Yes     Comment: rarely     Allergies   Shellfish allergy   Review of Systems Review of Systems  All other systems reviewed and are negative.    Physical Exam Updated Vital Signs BP 119/77 (BP Location: Right Arm)   Pulse 95   Temp 98.4 F (36.9 C) (Oral)   Resp 20   Ht 5\' 9"  (1.753 m)   Wt 240 lb (108.9 kg)   SpO2 99%   BMI 35.44 kg/m   Physical Exam  Nursing note and vitals reviewed.  41 year old male, resting comfortably and in no acute distress. Vital signs are normal. Oxygen saturation is 99%, which is normal. Head is normocephalic and atraumatic. PERRLA, EOMI. Oropharynx is clear. Neck is nontender and supple without adenopathy or JVD. Back is nontender and there is no CVA  tenderness. Lungs are clear without rales, wheezes, or rhonchi. Chest is nontender. Heart has regular rate and rhythm without murmur. Abdomen is soft, flat, nontender without masses or hepatosplenomegaly and peristalsis is normoactive. Extremities have no cyanosis or edema, full range of motion is present. Laceration is seen on the dorsum of the right hand overlying the fourth MCP joint. Distal neurovascular exam is intact. Lower laceration is oriented longitudinally. Skin is warm and dry without rash. Neurologic: Mental status is normal, cranial nerves are intact, there are no motor or sensory deficits.  ED Treatments / Results   Radiology Dg  Wrist Complete Right  Result Date: 07/09/2016 CLINICAL DATA:  Pt states he hit his right hand on a machine today. Pain and cut to right hand at 4th MCP. EXAM: RIGHT HAND - COMPLETE 3+ VIEW; RIGHT WRIST - COMPLETE 3+ VIEW COMPARISON:  None. FINDINGS: Views of the right hand and wrist demonstrate a small soft tissue laceration at the base of the right fourth finger. No radiopaque soft tissue foreign bodies or gas collections. Mild dorsal soft tissue swelling over the right hand. No evidence of acute fracture or dislocation. No focal bone lesion or bone destruction. Bone cortex appears intact. IMPRESSION: No acute bony abnormalities involving the right hand or right wrist. Soft tissue laceration over the right fourth finger. No radiopaque soft tissue foreign bodies. Electronically Signed   By: Burman Nieves M.D.   On: 07/09/2016 23:07   Dg Hand Complete Right  Result Date: 07/09/2016 CLINICAL DATA:  Pt states he hit his right hand on a machine today. Pain and cut to right hand at 4th MCP. EXAM: RIGHT HAND - COMPLETE 3+ VIEW; RIGHT WRIST - COMPLETE 3+ VIEW COMPARISON:  None. FINDINGS: Views of the right hand and wrist demonstrate a small soft tissue laceration at the base of the right fourth finger. No radiopaque soft tissue foreign bodies or gas collections. Mild dorsal soft tissue swelling over the right hand. No evidence of acute fracture or dislocation. No focal bone lesion or bone destruction. Bone cortex appears intact. IMPRESSION: No acute bony abnormalities involving the right hand or right wrist. Soft tissue laceration over the right fourth finger. No radiopaque soft tissue foreign bodies. Electronically Signed   By: Burman Nieves M.D.   On: 07/09/2016 23:07    Procedures Procedures (including critical care time) LACERATION REPAIR Performed by: [email or web address],UEAVW Authorized by: [email or web address] Consent: Verbal consent obtained. Risks and benefits: risks, benefits and alternatives were  discussed Consent given by: patient Patient identity confirmed: provided demographic data Prepped and Draped in normal sterile fashion Wound explored  Laceration Location: Right hand  Laceration Length: 1.5 cm  No Foreign Bodies seen or palpated  Anesthesia: None Amount of cleaning: standard  Skin closure: Close  Technique: Tissue adhesive   Patient tolerance: Patient tolerated the procedure well with no immediate complications.   Medications Ordered in ED Medications - No data to display   Initial Impression / Assessment and Plan / ED Course  I have reviewed the triage vital signs and the nursing notes.  Pertinent imaging results that were available during my care of the patient were reviewed by me and considered in my medical decision making (see chart for details).  Laceration of the right hand. Skin edges are not gaping, even when he makes a fist. Laceration is closed with tissue adhesive.  Final Clinical Impressions(s) / ED Diagnoses   Final diagnoses:  Laceration of superficial palmar arch of right hand, initial encounter  New Prescriptions Current Discharge Medication List       Dione Booze, MD 07/10/16 [phone]

## 2016-11-15 ENCOUNTER — Emergency Department (HOSPITAL_COMMUNITY)
Admission: EM | Admit: 2016-11-15 | Discharge: 2016-11-15 | Discharge status: Against Medical Advice | Payer: BLUE CROSS/BLUE SHIELD

## 2016-11-15 NOTE — ED Triage Notes (Signed)
Pt called for assigned triage room with no answer.

## 2017-02-03 DIAGNOSIS — Z79899 Other long term (current) drug therapy: Secondary | ICD-10-CM | POA: Insufficient documentation

## 2017-02-03 DIAGNOSIS — M542 Cervicalgia: Secondary | ICD-10-CM | POA: Insufficient documentation

## 2017-02-03 DIAGNOSIS — Z7982 Long term (current) use of aspirin: Secondary | ICD-10-CM | POA: Insufficient documentation

## 2017-02-03 DIAGNOSIS — F17210 Nicotine dependence, cigarettes, uncomplicated: Secondary | ICD-10-CM | POA: Insufficient documentation

## 2017-02-04 ENCOUNTER — Encounter (HOSPITAL_COMMUNITY): Payer: Self-pay

## 2017-02-04 ENCOUNTER — Emergency Department (HOSPITAL_COMMUNITY)
Admission: EM | Admit: 2017-02-04 | Discharge: 2017-02-04 | Discharge status: Against Medical Advice | Payer: Self-pay | Attending: Emergency Medicine | Admitting: Emergency Medicine

## 2017-02-04 ENCOUNTER — Emergency Department (HOSPITAL_COMMUNITY): Payer: Self-pay

## 2017-02-04 DIAGNOSIS — M542 Cervicalgia: Secondary | ICD-10-CM

## 2017-02-04 LAB — BASIC METABOLIC PANEL
ANION GAP: 7 (ref 5–15)
BUN: 14 mg/dL (ref 6–20)
CALCIUM: 8.8 mg/dL — AB (ref 8.9–10.3)
CO2: 24 mmol/L (ref 22–32)
Chloride: 107 mmol/L (ref 101–111)
Creatinine, Ser: 0.91 mg/dL (ref 0.61–1.24)
GFR calc Af Amer: >60 mL/min (ref >60)
Glucose, Bld: 134 mg/dL — ABNORMAL HIGH (ref 65–99)
POTASSIUM: 4 mmol/L (ref 3.5–5.1)
Sodium: 138 mmol/L (ref 135–145)

## 2017-02-04 LAB — CBC WITH DIFFERENTIAL/PLATELET
BASOS ABS: 0 10*3/uL (ref 0.0–0.1)
BASOS PCT: 1 %
Eosinophils Absolute: 0.4 10*3/uL (ref 0.0–0.7)
Eosinophils Relative: 5 %
HCT: 40 % (ref 39.0–52.0)
Hemoglobin: 13.3 g/dL (ref 13.0–17.0)
LYMPHS ABS: 4.2 10*3/uL — AB (ref 0.7–4.0)
LYMPHS PCT: 52 %
MCH: 28.2 pg (ref 26.0–34.0)
MCHC: 33.3 g/dL (ref 30.0–36.0)
MCV: 84.9 fL (ref 78.0–100.0)
Monocytes Absolute: 0.5 10*3/uL (ref 0.1–1.0)
Monocytes Relative: 6 %
NEUTROS ABS: 3.1 10*3/uL (ref 1.7–7.7)
NEUTROS PCT: 38 %
PLATELETS: 269 10*3/uL (ref 150–400)
RBC: 4.71 MIL/uL (ref 4.22–5.81)
RDW: 13.2 % (ref 11.5–15.5)
WBC: 8.1 10*3/uL (ref 4.0–10.5)

## 2017-02-04 MED ORDER — KETOROLAC TROMETHAMINE 30 MG/ML IJ SOLN: 15.0000 mg | Freq: Once | INTRAMUSCULAR | Status: DC

## 2017-02-04 MED ORDER — IOPAMIDOL (ISOVUE-300) INJECTION 61%
INTRAVENOUS | Status: AC
  Filled 2017-02-04: qty 75

## 2017-02-04 MED ORDER — NAPROXEN 375 MG PO TABS: 375.0000 mg | ORAL_TABLET | Freq: Two times a day (BID) | ORAL | 0 refills | Status: DC

## 2017-02-04 MED ORDER — KETOROLAC TROMETHAMINE 30 MG/ML IJ SOLN
15.0000 mg | Freq: Once | INTRAMUSCULAR | Status: AC
  Administered 2017-02-04: 15 mg via INTRAMUSCULAR
  Filled 2017-02-04: qty 1

## 2017-02-04 MED ORDER — CYCLOBENZAPRINE HCL 10 MG PO TABS: 10.0000 mg | ORAL_TABLET | Freq: Two times a day (BID) | ORAL | 0 refills | Status: DC | PRN

## 2017-02-04 MED ORDER — IOPAMIDOL (ISOVUE-300) INJECTION 61%: 75.0000 mL | Freq: Once | INTRAVENOUS | Status: DC | PRN

## 2017-02-04 NOTE — ED Provider Notes (Signed)
[facility]-EMERGENCY DEPT Provider Note   [name]: [id number] Arrival date & time: 02/03/17  2329     History   Chief Complaint Chief Complaint  Patient presents with  . throat pain    HPI Luis Pena is a 41 y.o. male.  HPI 41 year old African-American male with no significant past medical history presents to the emergency department today with complaints of anterior neck pain. Patient states that his symptoms started 2 days ago. Endorses swelling to his anterior neck and pain with movement. The patient states he did fall 3 days ago while reporting but denies hitting his head. Patient has not taken anything for his pain prior to arrival. Nothing makes better. Movement makes the pain worse. Patient denies any sore throat, difficulty swallowing, difficulties breathing, fever, history of cancer. Patient denies any other associated symptoms including chest pain, shortness of breath, lightheadedness, dizziness, headache, vision changes, otalgia, rhinorrhea, cough, recent illness, abdominal pain, nausea, emesis, urinary symptoms. Past Medical History:  Diagnosis Date  . Abscess of tendon sheath of left lower leg 05/2015  . Foreign body of left lower leg 05/2015  . Foreign body of leg, left, superficial, infected 06/06/2015  . History of MRSA infection 2016   scrotum    Patient Active Problem List   Diagnosis Date Noted  . Foreign body of leg, left, superficial, infected 06/06/2015  . Cellulitis of right lower extremity 01/23/2014    Past Surgical History:  Procedure Laterality Date  . FOREIGN BODY REMOVAL Left 06/06/2015   Procedure: REMOVAL FOREIGN BODY ;  Surgeon: Teryl Lucy, MD;  Location: [facility];  Service: Orthopedics;  Laterality: Left;  . HAND HARDWARE REMOVAL Right   . INCISION AND DRAINAGE ABSCESS Left 06/06/2015   Procedure: LEFT LOWER LEG INCISION AND DRAINAGE ;  Surgeon: Teryl Lucy, MD;  Location: [facility];  Service: Orthopedics;   Laterality: Left;  . OPEN REDUCTION INTERNAL FIXATION (ORIF) HAND Right 2010       Home Medications    Prior to Admission medications   Medication Sig Start Date End Date Taking? Authorizing Provider  acetaminophen (TYLENOL) 500 MG tablet Take 1,000 mg by mouth every 6 (six) hours as needed for mild pain.    [provider]  aspirin-acetaminophen-caffeine (EXCEDRIN MIGRAINE) [phone] MG tablet Take 1 tablet by mouth every 6 (six) hours as needed for headache.    [provider]  butalbital-acetaminophen-caffeine (FIORICET, ESGIC) 50-325-40 MG tablet Take 1-2 tablets by mouth every 8 (eight) hours as needed for headache. 02/24/16 02/23/17  Antony Madura, PA-C  cyclobenzaprine (FLEXERIL) 10 MG tablet Take 1 tablet (10 mg total) by mouth 2 (two) times daily as needed for muscle spasms. 02/04/17   Rise Mu, PA-C  ibuprofen (ADVIL,MOTRIN) 200 MG tablet Take 400 mg by mouth every 6 (six) hours as needed for moderate pain.    [provider]  naproxen (NAPROSYN) 375 MG tablet Take 1 tablet (375 mg total) by mouth 2 (two) times daily. 02/04/17   Rise Mu, PA-C    Family History History reviewed. No pertinent family history.  Social History Social History  Substance Use Topics  . Smoking status: Current Every Day Smoker    Packs/day: 0.00    Years: 2.00    Types: Cigarettes  . Smokeless tobacco: Never Used     Comment: 3 cig./day  . Alcohol use Yes     Comment: rarely     Allergies   Shellfish allergy   Review of Systems  Review of Systems  Constitutional: Negative for chills and fever.  HENT: Negative for congestion, ear pain, sore throat and trouble swallowing.   Eyes: Negative for visual disturbance.  Respiratory: Negative for cough and shortness of breath.   Cardiovascular: Negative for chest pain.  Gastrointestinal: Negative for abdominal pain, nausea and vomiting.  Musculoskeletal: Positive for neck pain. Negative for  arthralgias and myalgias.  Skin: Negative for rash.  Neurological: Negative for dizziness, syncope, weakness, light-headedness, numbness and headaches.  Psychiatric/Behavioral: Negative for sleep disturbance. The patient is not nervous/anxious.      Physical Exam Updated Vital Signs BP 130/86   Pulse 81   Temp 98.5 F (36.9 C) (Oral)   Resp 18   SpO2 96%   Physical Exam  Constitutional: He is oriented to person, place, and time. He appears well-developed and well-nourished. No distress.  HENT:  Head: Normocephalic and atraumatic.  Patient is speaking in complete sentences.. Managing secretions and tolerating airway.  Eyes: Right eye exhibits no discharge. Left eye exhibits no discharge. No scleral icterus.  Neck: Normal range of motion. Neck supple. No tracheal deviation present. No thyromegaly present.  Patient with a tender bilateral anterior cervical lymphadenopathy that is mobile. Unable to determine if goiter is present.there is no nuchal rigidity.  Cardiovascular: Normal rate.   Pulmonary/Chest: Effort normal and breath sounds normal. No stridor. No respiratory distress. He has no wheezes. He has no rales. He exhibits no tenderness.  Musculoskeletal: Normal range of motion.  Lymphadenopathy:    He has cervical adenopathy.  Neurological: He is alert and oriented to person, place, and time.  Skin: Skin is warm and dry. Capillary refill takes less than 2 seconds. No pallor.  Psychiatric: His behavior is normal. Judgment and thought content normal.  Nursing note and vitals reviewed.    ED Treatments / Results  Labs (all labs ordered are listed, but only abnormal results are displayed) Labs Reviewed  CBC WITH DIFFERENTIAL/PLATELET - Abnormal; Notable for the following:       Result Value   Lymphs Abs 4.2 (*)    All other components within normal limits  BASIC METABOLIC PANEL - Abnormal; Notable for the following:    Glucose, Bld 134 (*)    Calcium 8.8 (*)    All other  components within normal limits    EKG  EKG Interpretation None       Radiology No results found.  Procedures Procedures (including critical care time)  Medications Ordered in ED Medications  iopamidol (ISOVUE-300) 61 % injection 75 mL (not administered)  iopamidol (ISOVUE-300) 61 % injection (not administered)  ketorolac (TORADOL) 30 MG/ML injection 15 mg (15 mg Intramuscular Given 02/04/17 0504)     Initial Impression / Assessment and Plan / ED Course  I have reviewed the triage vital signs and the nursing notes.  Pertinent labs & imaging results that were available during my care of the patient were reviewed by me and considered in my medical decision making (see chart for details).     Patient presents to the ED with complaints of anterior neck swelling and pain.patient denies any difficulty breathing or swallowing. Denies any URI symptoms. Denies any sore throat or fever.  Patient is overall well-appearing and nontoxic. Vital signs are reassuring. Patient is afebrile and no tachycardia noted. Patient is managing secretions and maintaining his airway. Lungs sounds are clear to auscultation. Patient has no nuchal rigidity noted concerning for meningitis. He does have some bilateral anterior cervical lymphadenopathy that is mobile and  tender. Unable to determine if thyromegaly is present. Given that patient has no associated symptoms that would be concerning for a reactive lymphadenopathy felt that imaging with a CT soft neck to assess thyroid and to rule out deep neck infection.   Lab work was obtained. No leukocytosis noted. All other lab work is reassuring. Patient informed me that he did not want to have the CT scan. States that it has radiation he did not want to have further radiation given a had a prior CT scan several months ago for a migraine.  Discussed with patient that I was unable to determine if there is a deep neck infection or if there is any thyroid  abnormalities without a CT scan. Could possbly have reactive lymphadenopathy. The patient states that he will take anti-inflammatories and muscle relaxer and a symptoms persist or return to the ED. The patient does report falling 3 days ago but denies any head or neck injury. Patient has no focal neuro deficit on exam. Dicussed close follow up.  They have decided to leave AMA. I have discussed my concerns as a provider and the possibility that this may worsen. We discussed the nature, risks and benefits, and alternatives to treatment. I have specifically discussed that without further evaluation I cannot guarantee there is not a life threatening event occuring.  Time was given to allow the opportunity to ask questions and consider the options, and after the discussion, the patient decided to refuse the offered treatment. Pt is A&Ox4, his own POA and states understanding of my concerns and the possible consequences. After refusal, I made every reasonable opportunity to treat them to the best of my ability. I have made the patient aware that this is an AMA discharge, but they may return at any time for further evaluation and treatment.    Final Clinical Impressions(s) / ED Diagnoses   Final diagnoses:  Neck pain    New Prescriptions Discharge Medication List as of 02/04/2017  4:59 AM    START taking these medications   Details  cyclobenzaprine (FLEXERIL) 10 MG tablet Take 1 tablet (10 mg total) by mouth 2 (two) times daily as needed for muscle spasms., Starting Fri 02/04/2017, Print    naproxen (NAPROSYN) 375 MG tablet Take 1 tablet (375 mg total) by mouth 2 (two) times daily., Starting Fri 02/04/2017, Print         Rise Mu, PA-C 02/04/17 1610    Nicanor Alcon, April, MD 02/04/17 [phone]

## 2017-02-04 NOTE — ED Triage Notes (Signed)
Pt complains of throat pain for two days He states that it's sore on the inside and swollen on the outside He also says that he can't move his neck

## 2017-02-04 NOTE — Discharge Instructions (Signed)
Please take the Naproxen as prescribed for pain. Do not take any additional NSAIDs including Motrin, Aleve, Ibuprofen, Advil. Do not take until this afternoon have been given your first dose in the ed.  Please the the flexeril for muscle relaxation. This medication will make you drowsy so avoid situation that could place you in danger.   Unsure of etiology of your pain. Try the anti-inflammatories and muscle relaxers. If your symptoms worsen return to the ED  for difficulties breathing or swallowing.

## 2018-02-02 ENCOUNTER — Encounter (HOSPITAL_COMMUNITY): Payer: Self-pay | Admitting: *Deleted

## 2018-02-02 ENCOUNTER — Emergency Department (HOSPITAL_COMMUNITY): Payer: Managed Care, Other (non HMO)

## 2018-02-02 ENCOUNTER — Emergency Department (HOSPITAL_COMMUNITY)
Admission: EM | Admit: 2018-02-02 | Discharge: 2018-02-03 | Disposition: A | Discharge status: Home / Self Care | Payer: Managed Care, Other (non HMO) | Attending: Emergency Medicine | Admitting: Emergency Medicine

## 2018-02-02 ENCOUNTER — Other Ambulatory Visit: Payer: Self-pay

## 2018-02-02 DIAGNOSIS — R05 Cough: Secondary | ICD-10-CM | POA: Insufficient documentation

## 2018-02-02 DIAGNOSIS — R110 Nausea: Secondary | ICD-10-CM | POA: Insufficient documentation

## 2018-02-02 DIAGNOSIS — J069 Acute upper respiratory infection, unspecified: Secondary | ICD-10-CM | POA: Insufficient documentation

## 2018-02-02 DIAGNOSIS — F17210 Nicotine dependence, cigarettes, uncomplicated: Secondary | ICD-10-CM | POA: Insufficient documentation

## 2018-02-02 DIAGNOSIS — R509 Fever, unspecified: Secondary | ICD-10-CM | POA: Diagnosis present

## 2018-02-02 NOTE — ED Triage Notes (Signed)
Pt reports fever, generalized body aches, and productive cough since last night.  Pt also reports h/a.  He is A&O x 4 and in NAD.

## 2018-02-02 NOTE — ED Provider Notes (Signed)
[facility]-EMERGENCY DEPT Provider Note   [name]: [id number] Arrival date & time: 02/02/18  2028     History   Chief Complaint Chief Complaint  Patient presents with  . Fever  . Generalized Body Aches    HPI Luis Pena is a 42 y.o. male.  Patient reports nasal congestion, cough, body aches and fever onset two days ago. Occasional wheeze. Mild nausea and loss of appetite. Max temp 102. Fever responded to tylenol.  The history is provided by the patient. No language interpreter was used.  URI   This is a new problem. The current episode started 2 days ago. The problem has been gradually worsening. The maximum temperature recorded prior to his arrival was 102 to 102.9 F. Associated symptoms include nausea, congestion, sinus pain and cough. Pertinent negatives include no vomiting and no rash. Treatments tried: tylenol. The treatment provided moderate relief.    Past Medical History:  Diagnosis Date  . Abscess of tendon sheath of left lower leg 05/2015  . Foreign body of left lower leg 05/2015  . Foreign body of leg, left, superficial, infected 06/06/2015  . History of MRSA infection 2016   scrotum    Patient Active Problem List   Diagnosis Date Noted  . Foreign body of leg, left, superficial, infected 06/06/2015  . Cellulitis of right lower extremity 01/23/2014    Past Surgical History:  Procedure Laterality Date  . FOREIGN BODY REMOVAL Left 06/06/2015   Procedure: REMOVAL FOREIGN BODY ;  Surgeon: Teryl LucyJoshua Landau, MD;  Location: [facility];  Service: Orthopedics;  Laterality: Left;  . HAND HARDWARE REMOVAL Right   . INCISION AND DRAINAGE ABSCESS Left 06/06/2015   Procedure: LEFT LOWER LEG INCISION AND DRAINAGE ;  Surgeon: Teryl LucyJoshua Landau, MD;  Location: [facility];  Service: Orthopedics;  Laterality: Left;  . OPEN REDUCTION INTERNAL FIXATION (ORIF) HAND Right 2010        Home Medications    Prior to Admission  medications   Medication Sig Start Date End Date Taking? Authorizing Provider  acetaminophen (TYLENOL) 500 MG tablet Take 1,000 mg by mouth every 6 (six) hours as needed for mild pain.   Yes [provider]  aspirin-acetaminophen-caffeine (EXCEDRIN MIGRAINE) [phone] MG tablet Take 1 tablet by mouth every 6 (six) hours as needed for headache.   Yes [provider]  ibuprofen (ADVIL,MOTRIN) 200 MG tablet Take 400 mg by mouth every 6 (six) hours as needed for moderate pain.   Yes [provider]  cyclobenzaprine (FLEXERIL) 10 MG tablet Take 1 tablet (10 mg total) by mouth 2 (two) times daily as needed for muscle spasms. Patient not taking: Reported on 02/02/2018 02/04/17   Demetrios LollLeaphart, Kenneth T, PA-C  naproxen (NAPROSYN) 375 MG tablet Take 1 tablet (375 mg total) by mouth 2 (two) times daily. Patient not taking: Reported on 02/02/2018 02/04/17   Rise MuLeaphart, Kenneth T, PA-C    Family History No family history on file.  Social History Social History   Tobacco Use  . Smoking status: Current Every Day Smoker    Packs/day: 0.00    Years: 2.00    Pack years: 0.00    Types: Cigarettes  . Smokeless tobacco: Never Used  . Tobacco comment: 3 cig./day  Substance Use Topics  . Alcohol use: Yes    Comment: rarely  . Drug use: No     Allergies   Shellfish allergy   Review of Systems Review of Systems  Constitutional: Positive for  appetite change and fever.  HENT: Positive for congestion and sinus pain.   Respiratory: Positive for cough.   Gastrointestinal: Positive for nausea. Negative for vomiting.  Musculoskeletal: Positive for myalgias.  Skin: Negative for rash.  All other systems reviewed and are negative.    Physical Exam Updated Vital Signs BP (!) 151/82 (BP Location: Left Arm)   Pulse 86   Temp 97.8 F (36.6 C) (Oral)   Resp 18   Ht 5\' 9"  (1.753 m)   Wt 104.2 kg   SpO2 99%   BMI 33.94 kg/m   Physical Exam  Constitutional: He is oriented to  person, place, and time. He appears well-developed and well-nourished.  HENT:  Head: Normocephalic.  Nose: Mucosal edema present.  Mouth/Throat: Oropharynx is clear and moist. No oropharyngeal exudate.  Eyes: Conjunctivae are normal.  Neck: Neck supple.  Cardiovascular: Normal rate and regular rhythm.  Pulmonary/Chest: Effort normal. He has wheezes. He exhibits no tenderness.  Abdominal: Soft. Bowel sounds are normal.  Musculoskeletal: Normal range of motion. He exhibits no edema.  Lymphadenopathy:    He has no cervical adenopathy.  Neurological: He is alert and oriented to person, place, and time.  Skin: Skin is warm and dry.  Psychiatric: He has a normal mood and affect.  Nursing note and vitals reviewed.    ED Treatments / Results  Labs (all labs ordered are listed, but only abnormal results are displayed) Labs Reviewed - No data to display  EKG None  Radiology No results found.  Procedures Procedures (including critical care time)  Medications Ordered in ED Medications - No data to display   Initial Impression / Assessment and Plan / ED Course  I have reviewed the triage vital signs and the nursing notes.  Pertinent labs & imaging results that were available during my care of the patient were reviewed by me and considered in my medical decision making (see chart for details).     Pt symptoms consistent with URI. CXR negative for acute infiltrate. Pt will be discharged with symptomatic treatment.  Discussed return precautions.  Pt is hemodynamically stable & in NAD prior to discharge.  Final Clinical Impressions(s) / ED Diagnoses   Final diagnoses:  Viral upper respiratory tract infection    ED Discharge Orders    None       Felicie Morn, NP 02/02/18 2357    Mancel Bale, MD 02/03/18 0020

## 2018-05-27 ENCOUNTER — Emergency Department (HOSPITAL_COMMUNITY): Payer: Managed Care, Other (non HMO)

## 2018-05-27 ENCOUNTER — Emergency Department (HOSPITAL_COMMUNITY)
Admission: EM | Admit: 2018-05-27 | Discharge: 2018-05-27 | Disposition: A | Discharge status: Home / Self Care | Payer: Managed Care, Other (non HMO) | Attending: Emergency Medicine | Admitting: Emergency Medicine

## 2018-05-27 ENCOUNTER — Encounter (HOSPITAL_COMMUNITY): Payer: Self-pay | Admitting: Nurse Practitioner

## 2018-05-27 DIAGNOSIS — S199XXA Unspecified injury of neck, initial encounter: Secondary | ICD-10-CM | POA: Diagnosis present

## 2018-05-27 DIAGNOSIS — S134XXA Sprain of ligaments of cervical spine, initial encounter: Secondary | ICD-10-CM | POA: Insufficient documentation

## 2018-05-27 DIAGNOSIS — F17210 Nicotine dependence, cigarettes, uncomplicated: Secondary | ICD-10-CM | POA: Diagnosis not present

## 2018-05-27 DIAGNOSIS — Y999 Unspecified external cause status: Secondary | ICD-10-CM | POA: Insufficient documentation

## 2018-05-27 DIAGNOSIS — Y939 Activity, unspecified: Secondary | ICD-10-CM | POA: Diagnosis not present

## 2018-05-27 DIAGNOSIS — G44319 Acute post-traumatic headache, not intractable: Secondary | ICD-10-CM | POA: Diagnosis not present

## 2018-05-27 DIAGNOSIS — Y92410 Unspecified street and highway as the place of occurrence of the external cause: Secondary | ICD-10-CM | POA: Diagnosis not present

## 2018-05-27 DIAGNOSIS — M542 Cervicalgia: Secondary | ICD-10-CM

## 2018-05-27 DIAGNOSIS — M62838 Other muscle spasm: Secondary | ICD-10-CM

## 2018-05-27 MED ORDER — CYCLOBENZAPRINE HCL 10 MG PO TABS: 10.0000 mg | ORAL_TABLET | Freq: Three times a day (TID) | ORAL | 0 refills | Status: DC | PRN

## 2018-05-27 MED ORDER — ACETAMINOPHEN 500 MG PO TABS
1000.0000 mg | ORAL_TABLET | Freq: Once | ORAL | Status: AC
Start: 2018-05-27 — End: 2018-05-27
  Administered 2018-05-27: 1000 mg via ORAL
  Filled 2018-05-27: qty 2

## 2018-05-27 MED ORDER — NAPROXEN 500 MG PO TABS: 500.0000 mg | ORAL_TABLET | Freq: Two times a day (BID) | ORAL | 0 refills | Status: DC | PRN

## 2018-05-27 NOTE — Discharge Instructions (Addendum)
Take naprosyn as directed for inflammation and pain (take on a schedule 2x/day with food for the next 2-3 days, then as needed thereafter) with tylenol for breakthrough pain and flexeril for muscle relaxation. Do not drive or operate machinery with muscle relaxant use. Ice to areas of soreness for the next 24 hours and then may move to heat, no more than 20 minutes at a time every hour for each. Expect to be sore for the next few days and follow up with primary care physician for recheck of ongoing symptoms in the next 1-2 weeks. Return to ER for emergent changing or worsening of symptoms.    For your mild concussion: Alternate between naprosyn and Tylenol for pain. Get plenty of rest, use ice on your head.  Stay in a quiet, not simulating, dark environment. No TV, computer use, video games, or cell phone use until headache is resolved completely. Follow Up with primary care physician in 5-7days for recheck of symptoms.  Return to the emergency department if patient becomes lethargic, begins vomiting or other change in mental status, or any other changes/worsening symptoms.
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
## 2018-10-11 ENCOUNTER — Other Ambulatory Visit: Payer: Self-pay

## 2018-10-11 ENCOUNTER — Emergency Department (HOSPITAL_COMMUNITY)
Admission: EM | Admit: 2018-10-11 | Discharge: 2018-10-11 | Disposition: A | Discharge status: Home / Self Care | Payer: Self-pay | Attending: Emergency Medicine | Admitting: Emergency Medicine

## 2018-10-11 ENCOUNTER — Encounter (HOSPITAL_COMMUNITY): Payer: Self-pay | Admitting: *Deleted

## 2018-10-11 ENCOUNTER — Emergency Department (HOSPITAL_COMMUNITY): Payer: Self-pay

## 2018-10-11 DIAGNOSIS — F17210 Nicotine dependence, cigarettes, uncomplicated: Secondary | ICD-10-CM | POA: Insufficient documentation

## 2018-10-11 DIAGNOSIS — R51 Headache: Secondary | ICD-10-CM | POA: Insufficient documentation

## 2018-10-11 DIAGNOSIS — R519 Headache, unspecified: Secondary | ICD-10-CM

## 2018-10-11 MED ORDER — ACETAMINOPHEN 500 MG PO TABS
1000.0000 mg | ORAL_TABLET | Freq: Once | ORAL | Status: AC
  Administered 2018-10-11: 1000 mg via ORAL
  Filled 2018-10-11: qty 2
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
## 2018-10-11 NOTE — Discharge Instructions (Signed)
Take Tylenol as needed for your headache.  Follow-up with your primary care provider within a week for continued evaluation of your headaches.  Please return to the ED for any new or worsening symptoms or concerns, such as neck pain, fevers, vomiting, passing out, new or worsening headache or any concerns at all.

## 2018-10-11 NOTE — ED Provider Notes (Signed)
[facility] Provider Note   [name]: [id number] Arrival date & time: 10/11/18  1520    History   Chief Complaint Chief Complaint  Patient presents with  . Headache    HPI Aura FeyWillie J Klaiber is a 43 y.o. male.     HPI   43 year old male presents with intermittent headache for the last day.  Patient describes headache as sudden onset within several seconds x3 in the last 24 hours.  He describes headache as a pressure in the left side of the head extending from the left periorbital region to the left occipital region.  He denies any head injury or trauma since January.  Notes headache is severe in onset and then gradually improves without intervention and resolves with Tylenol.  Patient denies any associated syncope, nausea, vomiting, abdominal pain, fevers, chills, neck pain or stiffness.  The last headache started approximately 30 minutes prior to arrival and while waiting in the ED has improved to a 1 out of 10.  Past Medical History:  Diagnosis Date  . Abscess of tendon sheath of left lower leg 05/2015  . Foreign body of left lower leg 05/2015  . Foreign body of leg, left, superficial, infected 06/06/2015  . History of MRSA infection 2016   scrotum    Patient Active Problem List   Diagnosis Date Noted  . Foreign body of leg, left, superficial, infected 06/06/2015  . Cellulitis of right lower extremity 01/23/2014    Past Surgical History:  Procedure Laterality Date  . FOREIGN BODY REMOVAL Left 06/06/2015   Procedure: REMOVAL FOREIGN BODY ;  Surgeon: Teryl LucyJoshua Landau, MD;  Location: [facility];  Service: Orthopedics;  Laterality: Left;  . HAND HARDWARE REMOVAL Right   . INCISION AND DRAINAGE ABSCESS Left 06/06/2015   Procedure: LEFT LOWER LEG INCISION AND DRAINAGE ;  Surgeon: Teryl LucyJoshua Landau, MD;  Location: [facility];  Service: Orthopedics;  Laterality: Left;  . OPEN REDUCTION INTERNAL FIXATION (ORIF) HAND Right  2010        Home Medications    Prior to Admission medications   Medication Sig Start Date End Date Taking? Authorizing Provider  acetaminophen (TYLENOL) 500 MG tablet Take 1,000 mg by mouth every 6 (six) hours as needed for mild pain.    [provider]  aspirin-acetaminophen-caffeine (EXCEDRIN MIGRAINE) [phone] MG tablet Take 1 tablet by mouth every 6 (six) hours as needed for headache.    [provider]  cyclobenzaprine (FLEXERIL) 10 MG tablet Take 1 tablet (10 mg total) by mouth 2 (two) times daily as needed for muscle spasms. Patient not taking: Reported on 02/02/2018 02/04/17   Demetrios LollLeaphart, Kenneth T, PA-C  cyclobenzaprine (FLEXERIL) 10 MG tablet Take 1 tablet (10 mg total) by mouth 3 (three) times daily as needed for muscle spasms. 05/27/18   Street, MincoMercedes, PA-C  ibuprofen (ADVIL,MOTRIN) 200 MG tablet Take 400 mg by mouth every 6 (six) hours as needed for moderate pain.    [provider]  naproxen (NAPROSYN) 375 MG tablet Take 1 tablet (375 mg total) by mouth 2 (two) times daily. Patient not taking: Reported on 02/02/2018 02/04/17   Demetrios LollLeaphart, Kenneth T, PA-C  naproxen (NAPROSYN) 500 MG tablet Take 1 tablet (500 mg total) by mouth 2 (two) times daily as needed for mild pain, moderate pain or headache (TAKE WITH MEALS.). 05/27/18   Street, BudMercedes, PA-C    Family History No family history on file.  Social History Social History   Tobacco  Use  . Smoking status: Current Every Day Smoker    Packs/day: 0.00    Years: 2.00    Pack years: 0.00    Types: Cigarettes  . Smokeless tobacco: Never Used  . Tobacco comment: 3 cig./day  Substance Use Topics  . Alcohol use: Yes    Comment: rarely  . Drug use: No     Allergies   Shellfish allergy   Review of Systems Review of Systems  Constitutional: Negative for chills and fever.  Respiratory: Negative for shortness of breath.   Cardiovascular: Negative for chest pain.  Gastrointestinal: Negative for  abdominal pain, nausea and vomiting.  Neurological: Positive for headaches.     Physical Exam Updated Vital Signs BP 136/82 (BP Location: Right Arm)   Pulse 88   Temp 98.4 F (36.9 C) (Oral)   Resp 14   SpO2 96%   Physical Exam Vitals signs and nursing note reviewed.  Constitutional:      Appearance: He is well-developed.  HENT:     Head: Normocephalic and atraumatic.  Eyes:     Conjunctiva/sclera: Conjunctivae normal.  Neck:     Musculoskeletal: Neck supple.  Cardiovascular:     Rate and Rhythm: Normal rate and regular rhythm.     Heart sounds: Normal heart sounds. No murmur.  Pulmonary:     Effort: Pulmonary effort is normal. No respiratory distress.     Breath sounds: Normal breath sounds. No wheezing or rales.  Abdominal:     General: Bowel sounds are normal. There is no distension.     Palpations: Abdomen is soft.     Tenderness: There is no abdominal tenderness.  Musculoskeletal: Normal range of motion.        General: No tenderness or deformity.  Skin:    General: Skin is warm and dry.     Findings: No erythema or rash.  Neurological:     General: No focal deficit present.     Mental Status: He is alert and oriented to person, place, and time.     Cranial Nerves: Cranial nerves are intact.     Sensory: Sensation is intact.     Motor: Motor function is intact.     Coordination: Coordination is intact.  Psychiatric:        Behavior: Behavior normal.      ED Treatments / Results  Labs (all labs ordered are listed, but only abnormal results are displayed) Labs Reviewed - No data to display  EKG None  Radiology Ct Head Wo Contrast  Result Date: 10/11/2018 CLINICAL DATA:  Thunderclap headache EXAM: CT HEAD WITHOUT CONTRAST TECHNIQUE: Contiguous axial images were obtained from the base of the skull through the vertex without intravenous contrast. COMPARISON:  None. FINDINGS: Brain: There is no mass, hemorrhage or extra-axial collection. The size and  configuration of the ventricles and extra-axial CSF spaces are normal. The brain parenchyma is normal, without acute or chronic infarction. Vascular: No abnormal hyperdensity of the major intracranial arteries or dural venous sinuses. No intracranial atherosclerosis. Skull: The visualized skull base, calvarium and extracranial soft tissues are normal. Sinuses/Orbits: No fluid levels or advanced mucosal thickening of the visualized paranasal sinuses. No mastoid or middle ear effusion. The orbits are normal. IMPRESSION: Normal head CT. Electronically Signed   By: Deatra Robinson M.D.   On: 10/11/2018 18:13    Procedures Procedures (including critical care time)  Medications Ordered in ED Medications  acetaminophen (TYLENOL) tablet 1,000 mg (1,000 mg Oral Given 10/11/18 1738)  Initial Impression / Assessment and Plan / ED Course  I have reviewed the triage vital signs and the nursing notes.  Pertinent labs & imaging results that were available during my care of the patient were reviewed by me and considered in my medical decision making (see chart for details).        Patient presented with a headache, x3 in the last 24 hours.  He notes that the headache is sudden onset and reaches maximum severity within seconds.  His last episode was approximately 30 minutes prior to arrival.  Given concerning story, a CT of the head was ordered.  CT of the head is unremarkable, no evidence of concern for arachnoid hemorrhage.  Given that CT of the head was formed within 6 hours of headache onset, believe this is unlikely a subarachnoid hemorrhage.  He has no neck pain or stiffness, no cough or fevers, no evidence of meningitis.  Patient's symptoms have completely resolved with Tylenol, he is resting comfortably in bed, no acute distress, nontoxic, non-lethargic.  His neuro exam is nonfocal, nonlateralizing.  He has no other complaints.  Encourage close follow-up with primary care.  Given strict return  precautions.  Patient ready and stable for discharge.   Final Clinical Impressions(s) / ED Diagnoses   Final diagnoses:  None    ED Discharge Orders    None       Rueben Bash 10/11/18 2221    Geoffery Lyons, MD 10/16/18 [phone]

## 2020-06-06 DIAGNOSIS — M67432 Ganglion, left wrist: Secondary | ICD-10-CM

## 2020-06-06 DIAGNOSIS — M67431 Ganglion, right wrist: Secondary | ICD-10-CM | POA: Insufficient documentation

## 2020-06-06 HISTORY — DX: Ganglion, right wrist: M67.431

## 2020-06-06 HISTORY — DX: Ganglion, left wrist: M67.432

## 2020-07-13 IMAGING — CT CT HEAD WITHOUT CONTRAST
3 series · 15 of 47 positions shown, 18 images · non-contrast
Comparison: None.

CLINICAL DATA: Thunderclap headache

EXAM:
CT HEAD WITHOUT CONTRAST
TECHNIQUE: Contiguous axial images were obtained from the base of the skull
through the vertex without intravenous contrast.

[Series 3: head 5.0 h30s · axial · 0.46mm/px · z∈[-84,+66]mm · 9 of 36 slices shown, 12 images]
[im 3/36  brain]
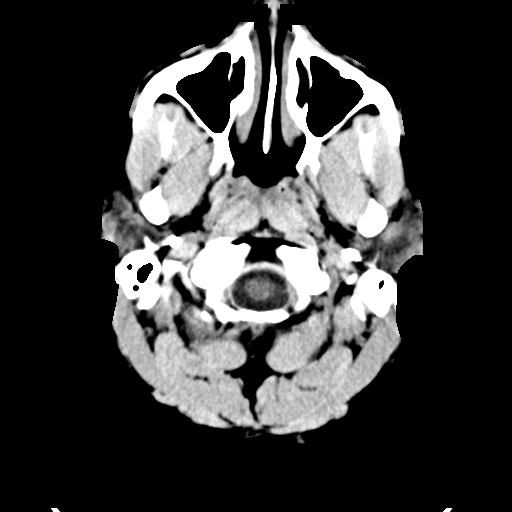
[im 3/36  bone]
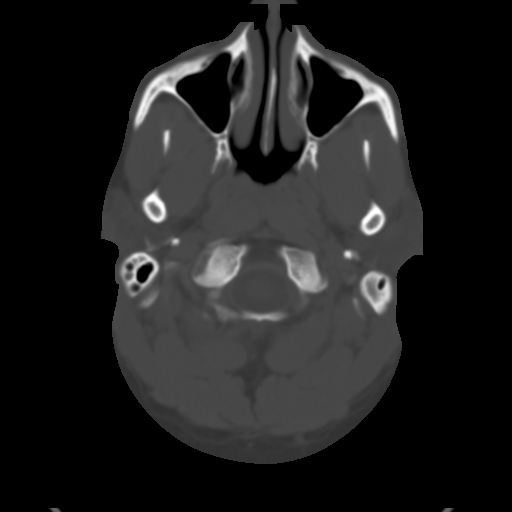
[im 7/36  brain]
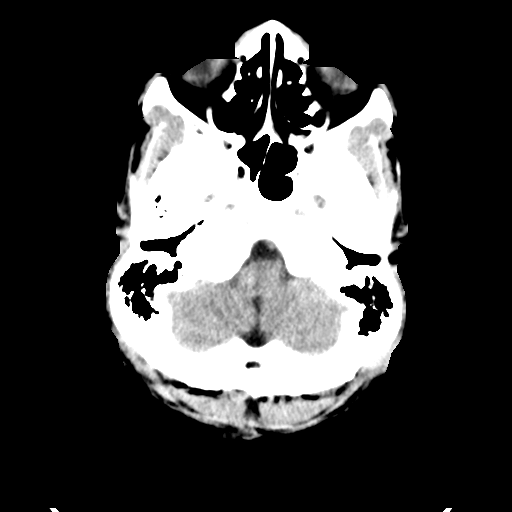
[im 10/36  brain]
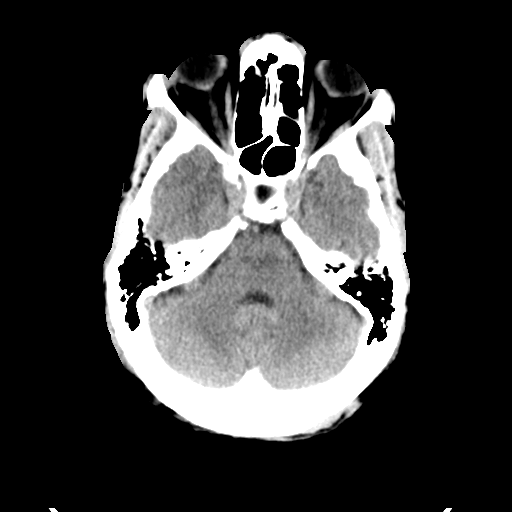
[im 14/36  brain]
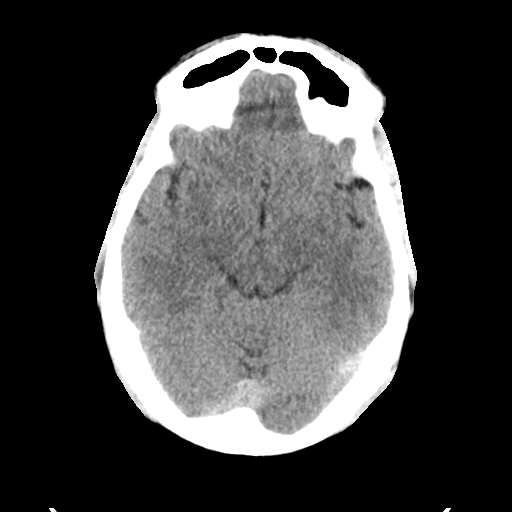
[im 19/36  brain]
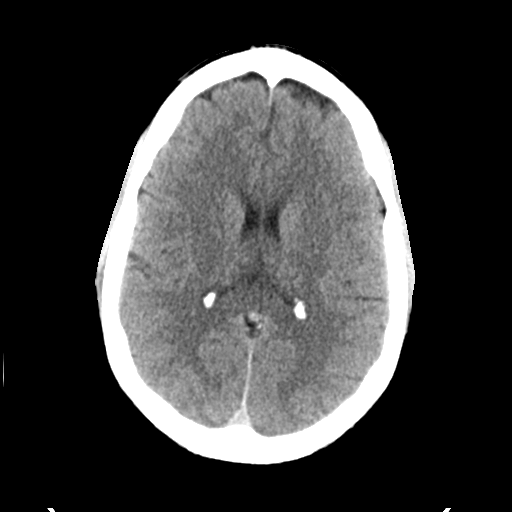
[im 19/36  bone]
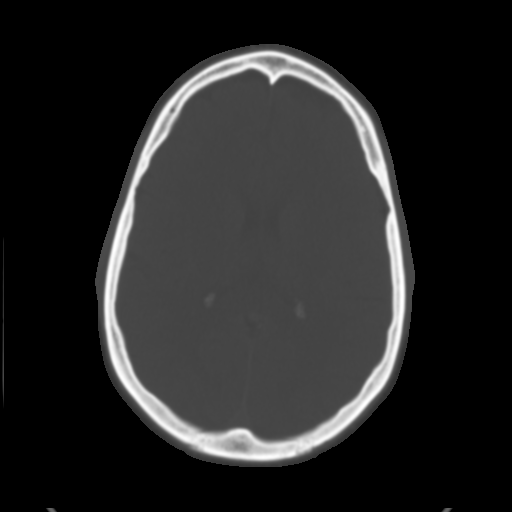
[im 22/36  brain]
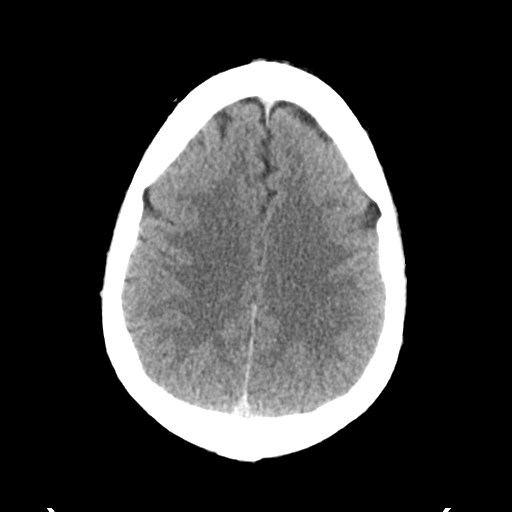
[im 26/36  brain]
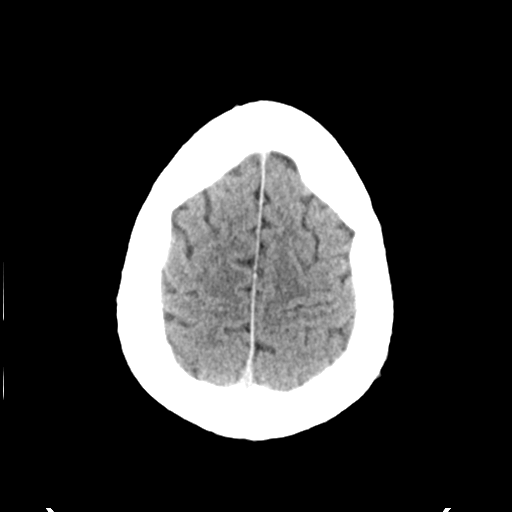
[im 29/36  brain]
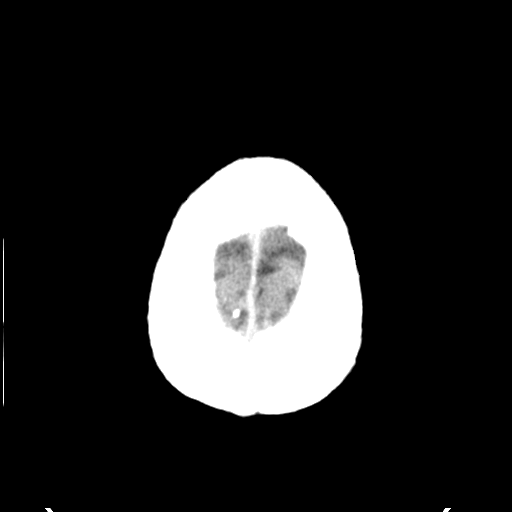
[im 33/36  brain]
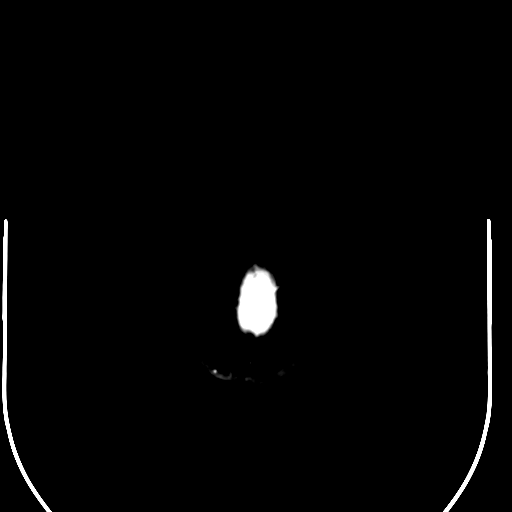
[im 33/36  bone]
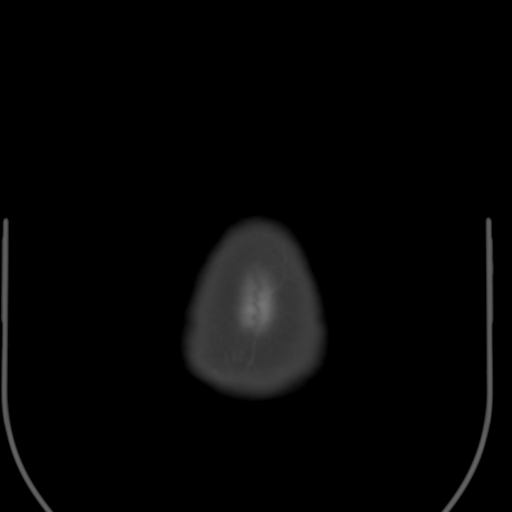

[Series 5: head 3.0 mpr cor · coronal · 0.35mm/px · 3 of 74 slices shown]
[im 25/74  brain]
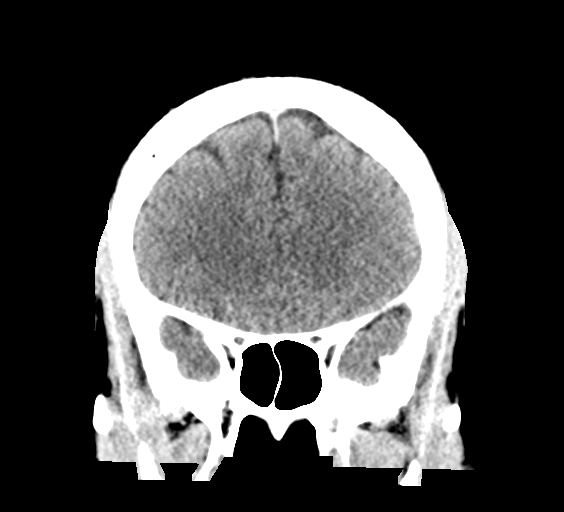
[im 33/74  brain]
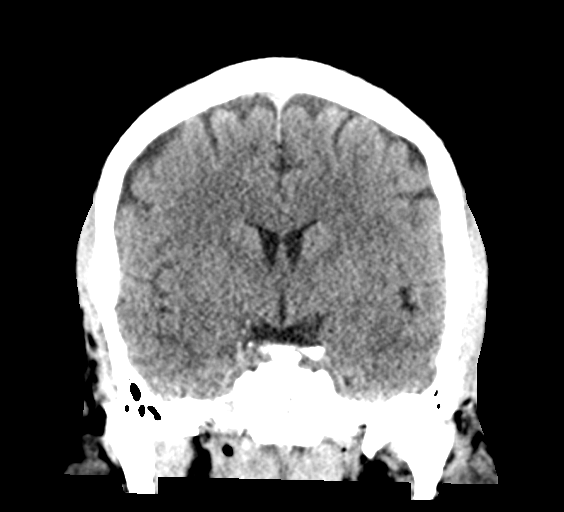
[im 41/74  brain]
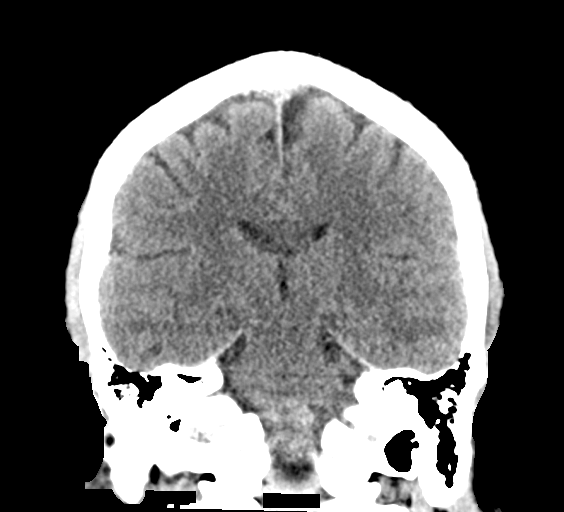

[Series 6: head 3.0 mpr sag · sagittal · 0.32mm/px · 3 of 65 slices shown]
[im 22/65  brain]
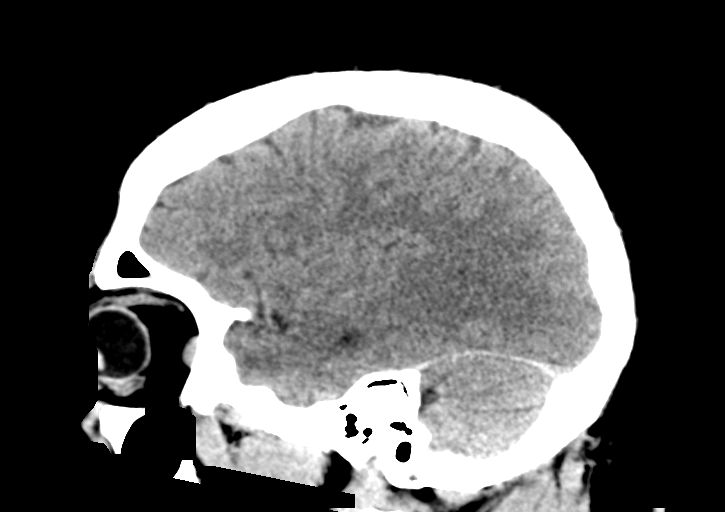
[im 33/65  brain]
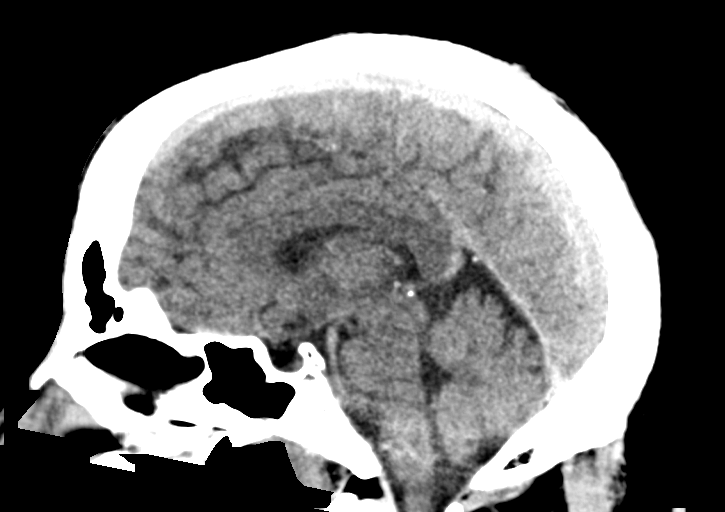
[im 43/65  brain]
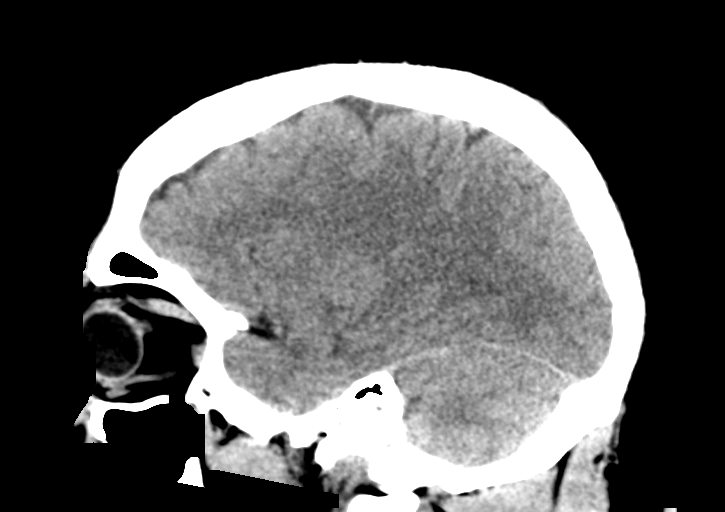

[15 of 47 positions shown; findings below may reference images not displayed]

FINDINGS: Brain: There is no mass, hemorrhage or extra-axial collection. The
size and configuration of the ventricles and extra-axial CSF spaces
are normal. The brain parenchyma is normal, without acute or chronic
infarction.

Vascular: No abnormal hyperdensity of the major intracranial
arteries or dural venous sinuses. No intracranial atherosclerosis.

Skull: The visualized skull base, calvarium and extracranial soft
tissues are normal.

Sinuses/Orbits: No fluid levels or advanced mucosal thickening of
the visualized paranasal sinuses. No mastoid or middle ear effusion.
The orbits are normal.
IMPRESSION: Normal head CT.

## 2021-08-30 ENCOUNTER — Encounter (HOSPITAL_COMMUNITY): Payer: Self-pay | Admitting: Emergency Medicine

## 2021-08-30 ENCOUNTER — Emergency Department (HOSPITAL_COMMUNITY)
Admission: EM | Admit: 2021-08-30 | Discharge: 2021-08-31 | Disposition: A | Discharge status: Home / Self Care | Payer: PRIVATE HEALTH INSURANCE | Attending: Emergency Medicine | Admitting: Emergency Medicine

## 2021-08-30 ENCOUNTER — Other Ambulatory Visit: Payer: Self-pay

## 2021-08-30 DIAGNOSIS — F17210 Nicotine dependence, cigarettes, uncomplicated: Secondary | ICD-10-CM | POA: Diagnosis not present

## 2021-08-30 DIAGNOSIS — M25561 Pain in right knee: Secondary | ICD-10-CM | POA: Insufficient documentation

## 2021-08-30 DIAGNOSIS — M79604 Pain in right leg: Secondary | ICD-10-CM | POA: Insufficient documentation

## 2021-08-30 NOTE — ED Notes (Addendum)
Pt ambulatory to room. Denies pain. C/O numbness to right leg and feeling "knots" behind right knee. Dorsalis pedis pulses equal and strong.  ?

## 2021-08-30 NOTE — ED Triage Notes (Signed)
Today pt noticed some "knots" behind his right knee that are concerning to him. No hx of blood clots. No pain. No warmth to touch. Does drive a truck for a living.  ?

## 2021-08-31 NOTE — ED Notes (Signed)
Discharge instructions discussed with pt. Pt verbalized understanding with no questions at this time. Pt to go home with s/o at bedside 

## 2021-08-31 NOTE — ED Provider Notes (Signed)
?[facility]-EMERGENCY DEPT ?[facility] Emergency Department ?Provider Note ?MRN:  [id number]  ?Arrival date & time: 08/31/21    ? ?Chief Complaint   ?Leg Pain ?  ?History of Present Illness   ?Luis Pena is a 46 y.o. year-old male with no pertinent past medical history presenting to the ED with chief complaint of leg pain. ? ?Pain behind the right knee related to a nodule that he found earlier today.  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  No pain to the rest of the leg, no chest pain or shortness of breath.  No trauma. ? ?Review of Systems  ?A thorough review of systems was obtained and all systems are negative except as noted in the HPI and PMH.  ? ?Patient's Health History   ? ?Past Medical History:  ?Diagnosis Date  ? Abscess of tendon sheath of left lower leg 05/2015  ? Foreign body of left lower leg 05/2015  ? Foreign body of leg, left, superficial, infected 06/06/2015  ? History of MRSA infection 2016  ? scrotum  ?  ?Past Surgical History:  ?Procedure Laterality Date  ? FOREIGN BODY REMOVAL Left 06/06/2015  ? Procedure: REMOVAL FOREIGN BODY ;  Surgeon: Teryl Lucy, MD;  Location: [facility];  Service: Orthopedics;  Laterality: Left;  ? HAND HARDWARE REMOVAL Right   ? INCISION AND DRAINAGE ABSCESS Left 06/06/2015  ? Procedure: LEFT LOWER LEG INCISION AND DRAINAGE ;  Surgeon: Teryl Lucy, MD;  Location: [facility];  Service: Orthopedics;  Laterality: Left;  ? OPEN REDUCTION INTERNAL FIXATION (ORIF) HAND Right 2010  ?  ?No family history on file.  ?Social History  ? ?Socioeconomic History  ? Marital status: Married  ?  Spouse name: Not on file  ? Number of children: Not on file  ? Years of education: Not on file  ? Highest education level: Not on file  ?Occupational History  ? Not on file  ?Tobacco Use  ? Smoking status: Every Day  ?  Packs/day: 0.00  ?  Years: 2.00  ?  Pack years: 0.00  ?  Types: Cigarettes  ? Smokeless  tobacco: Never  ? Tobacco comments:  ?  3 cig./day  ?Substance and Sexual Activity  ? Alcohol use: Yes  ?  Comment: rarely  ? Drug use: No  ? Sexual activity: Not on file  ?Other Topics Concern  ? Not on file  ?Social History Narrative  ? Not on file  ? ?Social Determinants of Health  ? ?Financial Resource Strain: Not on file  ?Food Insecurity: Not on file  ?Transportation Needs: Not on file  ?Physical Activity: Not on file  ?Stress: Not on file  ?Social Connections: Not on file  ?Intimate Partner Violence: Not on file  ?  ? ?Physical Exam  ? ?Vitals:  ? 08/30/21 2324  ?BP: (!) 176/100  ?Pulse: 94  ?Resp: 18  ?Temp: 98.5 ?F (36.9 ?C)  ?SpO2: 95%  ?  ?CONSTITUTIONAL: Well-appearing, NAD ?NEURO/PSYCH:  Alert and oriented x 3, no focal deficits ?EYES:  eyes equal and reactive ?ENT/NECK:  no LAD, no JVD ?CARDIO: Regular rate, well-perfused, normal S1 and S2 ?PULM:  CTAB no wheezing or rhonchi ?GI/GU:  non-distended, non-tender ?MSK/SPINE:  No gross deformities, no edema ?SKIN:  no rash, atraumatic; small papule nodule to the right popliteal fossa, estimated 1 to 2 cm in diameter ? ? ?*Additional and/or pertinent findings included in MDM below ? ?Diagnostic  and Interventional Summary  ? ? EKG Interpretation ? ?Date/Time:    ?Ventricular Rate:    ?PR Interval:    ?QRS Duration:   ?QT Interval:    ?QTC Calculation:   ?R Axis:     ?Text Interpretation:   ?  ? ?  ? ?Labs Reviewed - No data to display  ?No orders to display  ?  ?Medications - No data to display  ? ?Procedures  /  Critical Care ?Ultrasound ED Soft Tissue ? ?Date/Time: 08/31/2021 12:03 AM ?Performed by: Sabas Sous, MD ?Authorized by: Sabas Sous, MD  ? ?Procedure details:  ?  Indications: limb pain   ?  Transverse view:  Visualized ?  Longitudinal view:  Visualized ?  Images: archived   ?Location:  ?  Location: lower extremity   ?  Side:  Right ?Comments:  ?   Well-circumscribed nodule, hyperechoic ?Ultrasound ED DVT ? ?Date/Time: 08/31/2021 12:04  AM ?Performed by: Sabas Sous, MD ?Authorized by: Sabas Sous, MD  ? ?Procedure details:  ?  Indications: lower extremity pain   ?  Assessment for:  DVT ?  Images Archived: Yes   ?RLE Findings:  ?  Right common femoral vein:  Compressible ?  Right deep and superficial femoral veins:  Compressible ?  Right popliteal vein:  Compressible ?IMPRESSION: ?  DVT:  ?   None ? ?ED Course and Medical Decision Making  ?Initial Impression and Ddx ?Based on history and exam, very low concern for DVT.  DVT ultrasound is negative.  The nodule seems most likely to be a lipoma versus Baker's cyst versus less likely lymph node based on the morphology on ultrasound.  No signs of infection, no emergent process, appropriate for PCP follow-up, NSAIDs. ? ?Past medical/surgical history that increases complexity of ED encounter: None ? ?Interpretation of Diagnostics ?See above for ultrasound interpretation. ? ?Patient Reassessment and Ultimate Disposition/Management ?Discharge home ? ?Patient management required discussion with the following services or consulting groups:  None ? ?Complexity of Problems Addressed ?Acute illness or injury that poses threat of life of bodily function ? ?Additional Data Reviewed and Analyzed ?Further history obtained from: ?Further history from spouse/family member ? ?Additional Factors Impacting ED Encounter Risk ?Minor Procedures ? ?Elmer Sow. Pilar Plate, MD ?[facility] ?[facility] [email or web address] ? ?Final Clinical Impressions(s) / ED Diagnoses  ? ?  ICD-10-CM   ?1. Pain of right lower extremity  M79.604   ?  ?  ?ED Discharge Orders   ? ? None  ? ?  ?  ? ?Discharge Instructions Discussed with and Provided to Patient:  ? ? ?Discharge Instructions   ? ?  ?You were evaluated in the Emergency Department and after careful evaluation, we did not find any emergent condition requiring admission or further testing in the hospital. ? ?Your exam/testing today was overall  reassuring.  Ultrasound did not show any signs of a blood clot.  The nodule behind your right knee might be a Bakers cyst.  Recommend Tylenol and/or Motrin for discomfort and follow-up with your primary care doctor if the nodule does not go away over the next 1 or 2 weeks. ? ?Please return to the Emergency Department if you experience any worsening of your condition.  Thank you for allowing [place] to be a part of your care. ? ? ? ? ?  ?Sabas Sous, MD ?08/31/21 0006 ? ?

## 2021-08-31 NOTE — Discharge Instructions (Signed)
You were evaluated in the Emergency Department and after careful evaluation, we did not find any emergent condition requiring admission or further testing in the hospital. ? ?Your exam/testing today was overall reassuring.  Ultrasound did not show any signs of a blood clot.  The nodule behind your right knee might be a Bakers cyst.  Recommend Tylenol and/or Motrin for discomfort and follow-up with your primary care doctor if the nodule does not go away over the next 1 or 2 weeks. ? ?Please return to the Emergency Department if you experience any worsening of your condition.  Thank you for allowing [place] to be a part of your care. ? ?

## 2022-04-22 ENCOUNTER — Encounter (HOSPITAL_COMMUNITY): Payer: Self-pay

## 2022-04-22 ENCOUNTER — Emergency Department (HOSPITAL_COMMUNITY)
Admission: EM | Admit: 2022-04-22 | Discharge: 2022-04-22 | Disposition: A | Discharge status: Home / Self Care | Payer: No Typology Code available for payment source | Attending: Emergency Medicine | Admitting: Emergency Medicine

## 2022-04-22 ENCOUNTER — Other Ambulatory Visit: Payer: Self-pay

## 2022-04-22 DIAGNOSIS — Z7982 Long term (current) use of aspirin: Secondary | ICD-10-CM | POA: Insufficient documentation

## 2022-04-22 DIAGNOSIS — J069 Acute upper respiratory infection, unspecified: Secondary | ICD-10-CM | POA: Diagnosis not present

## 2022-04-22 DIAGNOSIS — Z1152 Encounter for screening for COVID-19: Secondary | ICD-10-CM | POA: Insufficient documentation

## 2022-04-22 DIAGNOSIS — R509 Fever, unspecified: Secondary | ICD-10-CM | POA: Diagnosis present

## 2022-04-22 LAB — RESP PANEL BY RT-PCR (FLU A&B, COVID) ARPGX2
Influenza A by PCR: NEGATIVE
Influenza B by PCR: NEGATIVE
SARS Coronavirus 2 by RT PCR: NEGATIVE

## 2022-04-22 MED ORDER — BENZONATATE 200 MG PO CAPS: 200.0000 mg | ORAL_CAPSULE | Freq: Three times a day (TID) | ORAL | 0 refills | Status: AC

## 2022-04-22 MED ORDER — IBUPROFEN 800 MG PO TABS
800.0000 mg | ORAL_TABLET | Freq: Once | ORAL | Status: AC
  Administered 2022-04-22: 800 mg via ORAL
  Filled 2022-04-22: qty 1

## 2022-04-22 NOTE — ED Provider Notes (Signed)
[facility]-EMERGENCY DEPT Provider Note   [name]: [id number] Arrival date & time: 04/22/22  8841     History  Chief Complaint  Patient presents with   Headache   Fever   Generalized Body Aches    Luis Pena is a 46 y.o. male.  46 year old man who presents with complaint of fever, headache, body aches, cough, congestion.  Symptoms started 3 days ago, max temp of 101 yesterday.  Last had Alka-Seltzer this morning prior to coming in.  Patient is a daily smoker.  No known sick contacts.  No significant past medical history otherwise.       Home Medications Prior to Admission medications   Medication Sig Start Date End Date Taking? Authorizing Provider  benzonatate (TESSALON) 200 MG capsule Take 1 capsule (200 mg total) by mouth every 8 (eight) hours for 10 days. 04/22/22 05/02/22 Yes Jeannie Fend, PA-C  acetaminophen (TYLENOL) 500 MG tablet Take 1,000 mg by mouth every 6 (six) hours as needed for mild pain.    [provider]  aspirin-acetaminophen-caffeine (EXCEDRIN MIGRAINE) [phone] MG tablet Take 1 tablet by mouth every 6 (six) hours as needed for headache.    [provider]  cyclobenzaprine (FLEXERIL) 10 MG tablet Take 1 tablet (10 mg total) by mouth 2 (two) times daily as needed for muscle spasms. Patient not taking: Reported on 02/02/2018 02/04/17   Demetrios Loll T, PA-C  cyclobenzaprine (FLEXERIL) 10 MG tablet Take 1 tablet (10 mg total) by mouth 3 (three) times daily as needed for muscle spasms. 05/27/18   Street, [place], PA-C  ibuprofen (ADVIL,MOTRIN) 200 MG tablet Take 400 mg by mouth every 6 (six) hours as needed for moderate pain.    [provider]  naproxen (NAPROSYN) 375 MG tablet Take 1 tablet (375 mg total) by mouth 2 (two) times daily. Patient not taking: Reported on 02/02/2018 02/04/17   Demetrios Loll T, PA-C  naproxen (NAPROSYN) 500 MG tablet Take 1 tablet (500 mg total) by mouth 2 (two) times daily  as needed for mild pain, moderate pain or headache (TAKE WITH MEALS.). 05/27/18   Street, [place], PA-C      Allergies    Shellfish allergy    Review of Systems   Review of Systems Negative except as per HPI Physical Exam Updated Vital Signs BP 135/88   Pulse (!) 105   Temp (!) 102.8 F (39.3 C) (Oral)   Resp 20   Ht 5\' 9"  (1.753 m)   Wt 108.9 kg   SpO2 97%   BMI 35.44 kg/m  Physical Exam Vitals and nursing note reviewed.  Constitutional:      General: He is not in acute distress.    Appearance: He is well-developed. He is not diaphoretic.  HENT:     Head: Normocephalic and atraumatic.     Right Ear: Tympanic membrane and ear canal normal.     Left Ear: Tympanic membrane and ear canal normal.     Mouth/Throat:     Mouth: Mucous membranes are moist.  Eyes:     Conjunctiva/sclera: Conjunctivae normal.  Cardiovascular:     Rate and Rhythm: Normal rate and regular rhythm.     Heart sounds: Normal heart sounds.  Pulmonary:     Effort: Pulmonary effort is normal.     Breath sounds: Normal breath sounds.  Musculoskeletal:     Cervical back: Neck supple.  Lymphadenopathy:     Cervical: No cervical adenopathy.  Skin:    General: Skin  is warm and dry.     Findings: No erythema or rash.  Neurological:     Mental Status: He is alert and oriented to person, place, and time.  Psychiatric:        Behavior: Behavior normal.     ED Results / Procedures / Treatments   Labs (all labs ordered are listed, but only abnormal results are displayed) Labs Reviewed  RESP PANEL BY RT-PCR (FLU A&B, COVID) [id number]    EKG None  Radiology No results found.  Procedures Procedures    Medications Ordered in ED Medications  ibuprofen (ADVIL) tablet 800 mg (800 mg Oral Given 04/22/22 1000)    ED Course/ Medical Decision Making/ A&P                           Medical Decision Making Risk Prescription drug management.   46 year old male presents with concern for URI symptoms  onset 3 days ago.  On exam, patient appears to feel unwell although nontoxic, in no distress.  Originally afebrile on arrival, at time of reassessment has chills and temp recheck of 102.8 orally.  Suspect patient's Alka-Seltzer cold from this morning has worn off, he is provided with Motrin prior to discharge.  Lungs are clear to auscultation, COVID and flu negative.  Discussed with patient and family member, likely viral illness.  Recommend supportive treatment at home.  Provided with prescription for Tessalon as needed for cough.  Discussed fever management with Motrin and Tylenol, monitoring for cold medications which might also contain Tylenol.  Return to ER for worsening or concerning symptoms, recheck with PCP as needed.        Final Clinical Impression(s) / ED Diagnoses Final diagnoses:  Viral upper respiratory tract infection    Rx / DC Orders ED Discharge Orders          Ordered    benzonatate (TESSALON) 200 MG capsule  Every 8 hours        04/22/22 0916              Tacy Learn, PA-C 04/22/22 1453    Tretha Sciara, MD 04/22/22 1536

## 2022-04-22 NOTE — ED Triage Notes (Signed)
Patient c/o fever, headache, and body aches x 3 days.

## 2022-04-22 NOTE — Discharge Instructions (Addendum)
Home to rest and hydrate. Motrin/Tylenol as needed as directed. May return to work when fever free for 24 hours without needing fever reducing medications.  Follow up with your PCP for recheck if symptoms persist. Return to ER for worsening or concerning symptoms.

## 2022-04-25 ENCOUNTER — Emergency Department (HOSPITAL_COMMUNITY): Payer: No Typology Code available for payment source

## 2022-04-25 ENCOUNTER — Other Ambulatory Visit: Payer: Self-pay

## 2022-04-25 ENCOUNTER — Emergency Department (HOSPITAL_COMMUNITY)
Admission: EM | Admit: 2022-04-25 | Discharge: 2022-04-25 | Disposition: A | Discharge status: Home / Self Care | Payer: No Typology Code available for payment source | Attending: Emergency Medicine | Admitting: Emergency Medicine

## 2022-04-25 DIAGNOSIS — R000 Tachycardia, unspecified: Secondary | ICD-10-CM | POA: Insufficient documentation

## 2022-04-25 DIAGNOSIS — Z7982 Long term (current) use of aspirin: Secondary | ICD-10-CM | POA: Diagnosis not present

## 2022-04-25 DIAGNOSIS — J189 Pneumonia, unspecified organism: Secondary | ICD-10-CM | POA: Diagnosis not present

## 2022-04-25 DIAGNOSIS — R509 Fever, unspecified: Secondary | ICD-10-CM | POA: Diagnosis present

## 2022-04-25 LAB — CBC
HCT: 40.1 % (ref 39.0–52.0)
Hemoglobin: 13.4 g/dL (ref 13.0–17.0)
MCH: 28.2 pg (ref 26.0–34.0)
MCHC: 33.4 g/dL (ref 30.0–36.0)
MCV: 84.4 fL (ref 80.0–100.0)
Platelets: 262 10*3/uL (ref 150–400)
RBC: 4.75 MIL/uL (ref 4.22–5.81)
RDW: 13.3 % (ref 11.5–15.5)
WBC: 10.6 10*3/uL — ABNORMAL HIGH (ref 4.0–10.5)
nRBC: 0 % (ref 0.0–0.2)

## 2022-04-25 LAB — BASIC METABOLIC PANEL
Anion gap: 11 (ref 5–15)
BUN: 14 mg/dL (ref 6–20)
CO2: 24 mmol/L (ref 22–32)
Calcium: 8.4 mg/dL — ABNORMAL LOW (ref 8.9–10.3)
Chloride: 102 mmol/L (ref 98–111)
Creatinine, Ser: 1.27 mg/dL — ABNORMAL HIGH (ref 0.61–1.24)
GFR, Estimated: >60 mL/min (ref >60)
Glucose, Bld: 150 mg/dL — ABNORMAL HIGH (ref 70–99)
Potassium: 3.7 mmol/L (ref 3.5–5.1)
Sodium: 137 mmol/L (ref 135–145)

## 2022-04-25 MED ORDER — AMOXICILLIN 500 MG PO CAPS
1000.0000 mg | ORAL_CAPSULE | Freq: Three times a day (TID) | ORAL | Status: AC
  Administered 2022-04-25: 1000 mg via ORAL
  Filled 2022-04-25: qty 2

## 2022-04-25 MED ORDER — ALBUTEROL SULFATE HFA 108 (90 BASE) MCG/ACT IN AERS
2.0000 | INHALATION_SPRAY | Freq: Once | RESPIRATORY_TRACT | Status: AC
  Administered 2022-04-25: 2 via RESPIRATORY_TRACT
  Filled 2022-04-25: qty 6.7

## 2022-04-25 MED ORDER — AZITHROMYCIN 250 MG PO TABS: 250.0000 mg | ORAL_TABLET | Freq: Every day | ORAL | 0 refills | Status: DC

## 2022-04-25 MED ORDER — AZITHROMYCIN 250 MG PO TABS
500.0000 mg | ORAL_TABLET | Freq: Once | ORAL | Status: AC
  Administered 2022-04-25: 500 mg via ORAL
  Filled 2022-04-25: qty 2

## 2022-04-25 MED ORDER — AMOXICILLIN 500 MG PO CAPS: 1000.0000 mg | ORAL_CAPSULE | Freq: Three times a day (TID) | ORAL | 0 refills | Status: AC

## 2022-04-25 MED ORDER — ACETAMINOPHEN 500 MG PO TABS
1000.0000 mg | ORAL_TABLET | Freq: Once | ORAL | Status: AC
  Administered 2022-04-25: 1000 mg via ORAL
  Filled 2022-04-25: qty 2

## 2022-04-25 NOTE — ED Notes (Signed)
Save blue tube in main lab °

## 2022-04-25 NOTE — Discharge Instructions (Addendum)
Take amoxicillin and azithromycin as prescribed for coverage of community-acquired pneumonia.  Follow-up with your primary care doctor in 2 to 3 days for recheck.  You may use 2 puffs of an albuterol inhaler every 4-6 hours, as needed, for cough or shortness of breath.  Continue over-the-counter Tylenol and/or ibuprofen for fever, body aches.  Return to the ED for new or concerning symptoms.

## 2022-04-25 NOTE — ED Provider Notes (Signed)
[facility]-EMERGENCY DEPT Provider Note   [name]: [id number] Arrival date & time: 04/25/22  3875     History  Chief Complaint  Patient presents with   Fever    Luis Pena is a 46 y.o. male.  46 y/o male presents to the ED for 6 days of fever (Tmax 102F), chills, cough, and R sided chest "heaviness". Symptoms constant and unchanged since last evaluation on Thursday. He has been using OTC medications w/o relief. Received Motrin last at 0100. Wife reports decreased PO intake, though patient still tolerating fluids. Patient denies hx of chronic lung disease as well as associated hemoptysis, leg swelling, syncope, recent surgeries or hospitalizations. No known sick contacts.   Fever      Home Medications Prior to Admission medications   Medication Sig Start Date End Date Taking? Authorizing Provider  amoxicillin (AMOXIL) 500 MG capsule Take 2 capsules (1,000 mg total) by mouth 3 (three) times daily for 5 days. 04/25/22 04/30/22 Yes Antony Madura, PA-C  azithromycin (ZITHROMAX) 250 MG tablet Take 1 tablet (250 mg total) by mouth daily. Take 1 every day until finished beginning on the morning of 04/26/22 04/25/22  Yes Antony Madura, PA-C  acetaminophen (TYLENOL) 500 MG tablet Take 1,000 mg by mouth every 6 (six) hours as needed for mild pain.    [provider]  aspirin-acetaminophen-caffeine (EXCEDRIN MIGRAINE) [phone] MG tablet Take 1 tablet by mouth every 6 (six) hours as needed for headache.    [provider]  benzonatate (TESSALON) 200 MG capsule Take 1 capsule (200 mg total) by mouth every 8 (eight) hours for 10 days. 04/22/22 05/02/22  Jeannie Fend, PA-C  cyclobenzaprine (FLEXERIL) 10 MG tablet Take 1 tablet (10 mg total) by mouth 2 (two) times daily as needed for muscle spasms. Patient not taking: Reported on 02/02/2018 02/04/17   Demetrios Loll T, PA-C  cyclobenzaprine (FLEXERIL) 10 MG tablet Take 1 tablet (10 mg total) by mouth 3  (three) times daily as needed for muscle spasms. 05/27/18   Street, [place], PA-C  ibuprofen (ADVIL,MOTRIN) 200 MG tablet Take 400 mg by mouth every 6 (six) hours as needed for moderate pain.    [provider]  naproxen (NAPROSYN) 375 MG tablet Take 1 tablet (375 mg total) by mouth 2 (two) times daily. Patient not taking: Reported on 02/02/2018 02/04/17   Demetrios Loll T, PA-C  naproxen (NAPROSYN) 500 MG tablet Take 1 tablet (500 mg total) by mouth 2 (two) times daily as needed for mild pain, moderate pain or headache (TAKE WITH MEALS.). 05/27/18   Street, [place], PA-C      Allergies    Shellfish allergy    Review of Systems   Review of Systems  Constitutional:  Positive for fever.  Ten systems reviewed and are negative for acute change, except as noted in the HPI.    Physical Exam Updated Vital Signs BP 124/74 (BP Location: Right Arm)   Pulse 94   Temp 99.8 F (37.7 C) (Oral)   Resp 18   Ht 5\' 9"  (1.753 m)   Wt 108.9 kg   SpO2 96%   BMI 35.44 kg/m   Physical Exam Vitals and nursing note reviewed.  Constitutional:      General: He is not in acute distress.    Appearance: He is well-developed.     Comments: Hot to touch; mildly diaphoretic.  HENT:     Head: Normocephalic and atraumatic.     Right Ear: External ear normal.  Left Ear: External ear normal.  Eyes:     General: No scleral icterus.    Conjunctiva/sclera: Conjunctivae normal.  Cardiovascular:     Rate and Rhythm: Regular rhythm. Tachycardia present.     Pulses: Normal pulses.     Comments: Mild tachycardia Pulmonary:     Effort: Pulmonary effort is normal. No respiratory distress.     Breath sounds: No wheezing or rhonchi.     Comments: Decreased breath sounds RML and RLL. Chest expansion symmetric. No dyspnea or tachypnea. Musculoskeletal:        General: Normal range of motion.     Cervical back: Normal range of motion.  Skin:    General: Skin is warm and dry.     Coloration: Skin is not  pale.     Findings: No erythema or rash.  Neurological:     Mental Status: He is alert and oriented to person, place, and time.     Coordination: Coordination normal.  Psychiatric:        Behavior: Behavior normal.     ED Results / Procedures / Treatments   Labs (all labs ordered are listed, but only abnormal results are displayed) Labs Reviewed  BASIC METABOLIC PANEL - Abnormal; Notable for the following components:      Result Value   Glucose, Bld 150 (*)    Creatinine, Ser 1.27 (*)    Calcium 8.4 (*)    All other components within normal limits  CBC - Abnormal; Notable for the following components:   WBC 10.6 (*)    All other components within normal limits    EKG None  Radiology DG Chest 2 View  Result Date: 04/25/2022 CLINICAL DATA:  Cough and fever. EXAM: CHEST - 2 VIEW COMPARISON:  February 02, 2018 FINDINGS: The heart size and mediastinal contours are within normal limits. Moderate severity infiltrate is seen within the right middle lobe. There is no evidence of a pleural effusion or pneumothorax. The visualized skeletal structures are unremarkable. IMPRESSION: Moderate severity right middle lobe infiltrate. Electronically Signed   By: Aram Candela M.D.   On: 04/25/2022 04:21    Procedures Procedures    Medications Ordered in ED Medications  amoxicillin (AMOXIL) capsule 1,000 mg (1,000 mg Oral Given 04/25/22 0544)  acetaminophen (TYLENOL) tablet 1,000 mg (1,000 mg Oral Given 04/25/22 0544)  azithromycin (ZITHROMAX) tablet 500 mg (500 mg Oral Given 04/25/22 0547)  albuterol (VENTOLIN HFA) 108 (90 Base) MCG/ACT inhaler 2 puff (2 puffs Inhalation Given 04/25/22 0604)    ED Course/ Medical Decision Making/ A&P Clinical Course as of 04/25/22 0610  Sun Apr 25, 2022  8979 Patient ambulatory with sats at or above 94% on RA. [KH]  0549 Patient's chest x-ray today is consistent with right middle lobe pneumonia.  He has been started on antibiotics in the ED.  No  hypoxia with ambulation.  Does not meet criteria for SIRS/sepsis based on ED workup.  Appropriate for continued outpatient antibiotic management.  Have encouraged close primary care follow-up for recheck in 2 to 3 days. [KH]    Clinical Course User Index [KH] Antony Madura, PA-C                           Medical Decision Making Amount and/or Complexity of Data Reviewed Labs: ordered. Radiology: ordered.  Risk OTC drugs. Prescription drug management.   This patient presents to the ED for concern of R sided chest pain with cough, chills, this  involves an extensive number of treatment options, and is a complaint that carries with it a high risk of complications and morbidity.  The differential diagnosis includes PNA vs viral illness vs pleural effusion vs PTX   Co morbidities that complicate the patient evaluation  None    Additional history obtained:  Additional history obtained from spouse at bedside External records from outside source obtained and reviewed including negative respiratory panel from 3 days ago.   Lab Tests:  I Ordered, and personally interpreted labs.  The pertinent results include:  WBC 10.6 w/left shift. Mild elevation of creatinine to 1.27.   Imaging Studies ordered:  I ordered imaging studies including CXR  I independently visualized and interpreted imaging which showed RML pneumonia I agree with the radiologist interpretation   Cardiac Monitoring:  The patient was maintained on a cardiac monitor.  I personally viewed and interpreted the cardiac monitored which showed an underlying rhythm of: sinus tachycardia > NSR   Medicines ordered and prescription drug management:  I ordered medication including Tylenol for subjective fever and abx for treatment of CAP  Reevaluation of the patient after these medicines showed that the patient  remained stable I have reviewed the patients home medicines and have made adjustments as needed   Test  Considered:  EKG   Problem List / ED Course:  As above   Reevaluation:  After the interventions noted above, I reevaluated the patient and found that they have : remained stable   Social Determinants of Health:  Insured patient Good social support   Dispostion:  After consideration of the diagnostic results and the patients response to treatment, I feel that the patent would benefit from outpatient course of abx. Nontoxic appearing and not hypoxic with ambulation. Encourage PCP f/u for recheck. Return precautions discussed and provided. Patient discharged in stable condition with no unaddressed concerns.          Final Clinical Impression(s) / ED Diagnoses Final diagnoses:  Community acquired pneumonia of right middle lobe of lung    Rx / DC Orders ED Discharge Orders          Ordered    amoxicillin (AMOXIL) 500 MG capsule  3 times daily        04/25/22 0548    azithromycin (ZITHROMAX) 250 MG tablet  Daily        04/25/22 0548              Antonietta Breach, PA-C 04/25/22 [id number]    Quintella Reichert, MD 04/25/22 [phone]

## 2022-04-25 NOTE — ED Notes (Signed)
Pt ambulated around the department with pulse ox. Pt O2 sats between 94% and 96%. Pt states her feels the same as he has been feeling all week.

## 2022-04-25 NOTE — ED Triage Notes (Addendum)
Pt reports headache, fever, cough, chills, right sided heaviness in his chest since Thursday. Pt was evaluated for same symptoms, reporting his symptoms are not any better. Has been taking OTC meds without relief. Last motrin around 0100.

## 2022-08-24 ENCOUNTER — Encounter (HOSPITAL_COMMUNITY): Payer: Self-pay

## 2022-08-24 ENCOUNTER — Ambulatory Visit (HOSPITAL_COMMUNITY)
Admission: EM | Admit: 2022-08-24 | Discharge: 2022-08-24 | Disposition: A | Discharge status: Home / Self Care | Payer: PRIVATE HEALTH INSURANCE | Attending: Internal Medicine | Admitting: Internal Medicine

## 2022-08-24 DIAGNOSIS — L0201 Cutaneous abscess of face: Secondary | ICD-10-CM

## 2022-08-24 MED ORDER — AMOXICILLIN-POT CLAVULANATE 875-125 MG PO TABS: 1.0000 | ORAL_TABLET | Freq: Two times a day (BID) | ORAL | 0 refills | Status: DC

## 2022-08-24 NOTE — ED Provider Notes (Signed)
[facility]    [name]: [id number] Arrival date & time: 08/24/22  1518      History   Chief Complaint Chief Complaint  Patient presents with   abscess to the left upper cheek    HPI Luis Pena is a 47 y.o. male.   Patient presents to urgent care for evaluation of abscess to the left cheek that started 6 days ago. States the area began as a small pimple and has grown significantly in size and pain over the last 1-2 days, even more so in the last 12-24 hours. No facial numbness, droop, or weakness. No drainage, redness, pain, or swelling to the eyes. No blurry vision, ear pain, headache, sore throat, difficulty swallowing/eating, or fever/chills. States he noticed a small amount of white drainage from the abscess but it has not "popped". No recent antibiotic or steroid use reported. Has not attempted use of medicines before coming to urgent care. Denies history of abscesses.     Past Medical History:  Diagnosis Date   Abscess of tendon sheath of left lower leg 05/2015   Foreign body of left lower leg 05/2015   Foreign body of leg, left, superficial, infected 06/06/2015   History of MRSA infection 2016   scrotum    Patient Active Problem List   Diagnosis Date Noted   Foreign body of leg, left, superficial, infected 06/06/2015   Cellulitis of right lower extremity 01/23/2014    Past Surgical History:  Procedure Laterality Date   FOREIGN BODY REMOVAL Left 06/06/2015   Procedure: REMOVAL FOREIGN BODY ;  Surgeon: Marchia Bond, MD;  Location: [place];  Service: Orthopedics;  Laterality: Left;   HAND HARDWARE REMOVAL Right    INCISION AND DRAINAGE ABSCESS Left 06/06/2015   Procedure: LEFT LOWER LEG INCISION AND DRAINAGE ;  Surgeon: Marchia Bond, MD;  Location: [place];  Service: Orthopedics;  Laterality: Left;   OPEN REDUCTION INTERNAL FIXATION (ORIF) HAND Right 2010       Home Medications    Prior to Admission  medications   Medication Sig Start Date End Date Taking? Authorizing Provider  amoxicillin-clavulanate (AUGMENTIN) 875-125 MG tablet Take 1 tablet by mouth every 12 (twelve) hours. 08/24/22  Yes Talbot Grumbling, FNP  acetaminophen (TYLENOL) 500 MG tablet Take 1,000 mg by mouth every 6 (six) hours as needed for mild pain.    [provider]  aspirin-acetaminophen-caffeine (EXCEDRIN MIGRAINE) [phone] MG tablet Take 1 tablet by mouth every 6 (six) hours as needed for headache.    [provider]  azithromycin (ZITHROMAX) 250 MG tablet Take 1 tablet (250 mg total) by mouth daily. Take 1 every day until finished beginning on the morning of 04/26/22 04/25/22   Antonietta Breach, PA-C  cyclobenzaprine (FLEXERIL) 10 MG tablet Take 1 tablet (10 mg total) by mouth 2 (two) times daily as needed for muscle spasms. Patient not taking: Reported on 02/02/2018 02/04/17   Ocie Cornfield T, PA-C  cyclobenzaprine (FLEXERIL) 10 MG tablet Take 1 tablet (10 mg total) by mouth 3 (three) times daily as needed for muscle spasms. 05/27/18   Street, [place], PA-C  ibuprofen (ADVIL,MOTRIN) 200 MG tablet Take 400 mg by mouth every 6 (six) hours as needed for moderate pain.    [provider]  naproxen (NAPROSYN) 375 MG tablet Take 1 tablet (375 mg total) by mouth 2 (two) times daily. Patient not taking: Reported on 02/02/2018 02/04/17   Ocie Cornfield T, PA-C  naproxen (NAPROSYN)  500 MG tablet Take 1 tablet (500 mg total) by mouth 2 (two) times daily as needed for mild pain, moderate pain or headache (TAKE WITH MEALS.). 05/27/18   Street, [place], PA-C    Family History Family History  Problem Relation Age of Onset   Diabetes Mother    Cancer Father     Social History Social History   Tobacco Use   Smoking status: Every Day    Packs/day: 0.50    Years: 2.00    Additional pack years: 0.00    Total pack years: 1.00    Types: Cigarettes   Smokeless tobacco: Never   Tobacco comments:    3  cig./day  Vaping Use   Vaping Use: Never used  Substance Use Topics   Alcohol use: Yes    Comment: rarely   Drug use: No     Allergies   Shellfish allergy   Review of Systems Review of Systems Per HPI  Physical Exam Triage Vital Signs ED Triage Vitals  Enc Vitals Group     BP 08/24/22 1700 130/85     Pulse Rate 08/24/22 1700 (!) 108     Resp 08/24/22 1700 16     Temp 08/24/22 1700 98.8 F (37.1 C)     Temp Source 08/24/22 1700 Oral     SpO2 08/24/22 1700 94 %     Weight --      Height --      Head Circumference --      Peak Flow --      Pain Score 08/24/22 1703 9     Pain Loc --      Pain Edu? --      Excl. in [place]? --    No data found.  Updated Vital Signs BP 130/85 (BP Location: Right Arm)   Pulse (!) 108   Temp 98.8 F (37.1 C) (Oral)   Resp 16   SpO2 94%   Visual Acuity Right Eye Distance:   Left Eye Distance:   Bilateral Distance:    Right Eye Near:   Left Eye Near:    Bilateral Near:     Physical Exam Vitals and nursing note reviewed.  Constitutional:      Appearance: He is not ill-appearing or toxic-appearing.  HENT:     Head: Normocephalic and atraumatic.     Jaw: There is normal jaw occlusion.      Comments: 1cm in diameter mildly fluctuant facial abscess to the lateral maxillary sinus area of the face as seen in image below. Abscess and surrounding area of erythema/soft tissue swelling is warm and very tender to touch. No drainage currently.     Right Ear: Hearing, tympanic membrane, ear canal and external ear normal.     Left Ear: Hearing, tympanic membrane, ear canal and external ear normal.     Nose: Nose normal.     Mouth/Throat:     Lips: Pink.     Mouth: Mucous membranes are moist. No injury.     Tongue: No lesions. Tongue does not deviate from midline.     Palate: No mass and lesions.     Pharynx: Oropharynx is clear. Uvula midline. No pharyngeal swelling, oropharyngeal exudate, posterior oropharyngeal erythema or uvula  swelling.     Tonsils: No tonsillar exudate or tonsillar abscesses.  Eyes:     General: Lids are normal. Vision grossly intact. Gaze aligned appropriately. No visual field deficit.    Extraocular Movements: Extraocular movements intact.  Conjunctiva/sclera: Conjunctivae normal.     Comments: EOMs intact. No pain or dizziness elicited with EOM exam. No preseptal swelling, erythema, or warmth.   Cardiovascular:     Rate and Rhythm: Normal rate and regular rhythm.     Heart sounds: Normal heart sounds, S1 normal and S2 normal.  Pulmonary:     Effort: Pulmonary effort is normal. No respiratory distress.     Breath sounds: Normal breath sounds and air entry.  Musculoskeletal:     Cervical back: Neck supple.  Skin:    General: Skin is warm and dry.     Capillary Refill: Capillary refill takes less than 2 seconds.     Findings: No rash.  Neurological:     General: No focal deficit present.     Mental Status: He is alert and oriented to person, place, and time. Mental status is at baseline.     Cranial Nerves: No cranial nerve deficit, dysarthria or facial asymmetry.     Sensory: Sensation is intact.     Motor: Motor function is intact.     Gait: Gait is intact.     Comments: Facial muscles intact and equal bilaterally.   Psychiatric:        Mood and Affect: Mood normal.        Speech: Speech normal.        Behavior: Behavior normal.        Thought Content: Thought content normal.        Judgment: Judgment normal.      UC Treatments / Results  Labs (all labs ordered are listed, but only abnormal results are displayed) Labs Reviewed - No data to display  EKG   Radiology No results found.  Procedures Procedures (including critical care time)  Medications Ordered in UC Medications - No data to display  Initial Impression / Assessment and Plan / UC Course  I have reviewed the triage vital signs and the nursing notes.  Pertinent labs & imaging results that were  available during my care of the patient were reviewed by me and considered in my medical decision making (see chart for details).   1. Facial abscess No systemic symptoms, stable HEENT exam with normal EOMs. Neurologically intact to baseline. No indication for imaging/referral to ED for workup. Vitals stable. Augmentin antibiotic to treat infection to abscess. Discussed options for treatment with patient. Would prefer not to drain this as it is on the face and very close to the maxillary sinus area. Patient is agreeable with this and would prefer to do warm compresses with antibiotics. Abscess will likely drain on its own in the next few days with compresses and antibiotics. Patient has been advised to return to clinic should he develop any new or worsening symptoms/if the abscess does not drain on its own. Patient is agreeable with this plan.   Discussed physical exam and available lab work findings in clinic with patient.  Counseled patient regarding appropriate use of medications and potential side effects for all medications recommended or prescribed today. Discussed red flag signs and symptoms of worsening condition,when to call the PCP office, return to urgent care, and when to seek higher level of care in the emergency department. Patient verbalizes understanding and agreement with plan. All questions answered. Patient discharged in stable condition.    Final Clinical Impressions(s) / UC Diagnoses   Final diagnoses:  Facial abscess     Discharge Instructions      Take Augmentin twice daily for the next  7 days to treat your abscess to your face. Apply warm compresses to the abscess. If you begin to have weakness on one side of your face or if the swelling worsens in the next 12 to 24 hours, you need to go to the nearest emergency department.  If your symptoms do not improve in the next 1 to 2 days while on antibiotics, you need to go to the emergency department for further evaluation.   Take Tylenol/ibuprofen as needed for pain.  I hope you feel better!   ED Prescriptions     Medication Sig Dispense Auth. Provider   amoxicillin-clavulanate (AUGMENTIN) 875-125 MG tablet Take 1 tablet by mouth every 12 (twelve) hours. 14 tablet Talbot Grumbling, FNP      PDMP not reviewed this encounter.   Talbot Grumbling, [place] 08/26/22 2300

## 2022-08-24 NOTE — ED Triage Notes (Signed)
Patient c/o an abscess to the left upper cheek near the left eye x 6 days. Patient states his left eye was swollen shut this AM. Patient denies blurred vision at this time.

## 2022-08-24 NOTE — Discharge Instructions (Signed)
Take Augmentin twice daily for the next 7 days to treat your abscess to your face. Apply warm compresses to the abscess. If you begin to have weakness on one side of your face or if the swelling worsens in the next 12 to 24 hours, you need to go to the nearest emergency department.  If your symptoms do not improve in the next 1 to 2 days while on antibiotics, you need to go to the emergency department for further evaluation.  Take Tylenol/ibuprofen as needed for pain.  I hope you feel better!

## 2022-12-04 NOTE — Progress Notes (Signed)
12/06/22- 47 yoM Smoker (0.5ppd) , married truck driver, for sleep evaluation with concern of OSA Medical problem list includes- tobacco User, Hx Pneumonia, Hx recurrent Skin Abscesses,  Epworth score- Body weight today-240 lbs Occupational health examiner told him he needed to have a sleep study done because of thick neck and body build.  He says he did  Snap home test, but we can access the results and he says it was "messed up", so he is interested in repeating. Bedtime routinely around 10 PM and often get up as early as 2:30 AM, working 12-hour shift.  Can fall asleep easily if he sits quietly but otherwise not aware of daytime sleepiness.  No sleep meds. He denies ENT surgery, heart or lung problems.  Denies family history of OSA except may be an uncle.  Always a short sleeper and he also says that his wife, kids and dogs disturb his sleep.  Prior to Admission medications   Not on File   Past Medical History:  Diagnosis Date   Abscess of tendon sheath of left lower leg 05/25/2015   Foreign body of left lower leg 05/25/2015   Foreign body of leg, left, superficial, infected 06/06/2015   History of MRSA infection 05/24/2014   scrotum   Skin cancer    Past Surgical History:  Procedure Laterality Date   FOREIGN BODY REMOVAL Left 06/06/2015   Procedure: REMOVAL FOREIGN BODY ;  Surgeon: Teryl Lucy, MD;  Location: [facility];  Service: Orthopedics;  Laterality: Left;   HAND HARDWARE REMOVAL Right    INCISION AND DRAINAGE ABSCESS Left 06/06/2015   Procedure: LEFT LOWER LEG INCISION AND DRAINAGE ;  Surgeon: Teryl Lucy, MD;  Location: [facility];  Service: Orthopedics;  Laterality: Left;   OPEN REDUCTION INTERNAL FIXATION (ORIF) HAND Right 2010   Family History  Problem Relation Age of Onset   Diabetes Mother    Cancer Father    Social History   Socioeconomic History   Marital status: Married    Spouse name: Not on file   Number of children: Not on  file   Years of education: Not on file   Highest education level: Not on file  Occupational History   Not on file  Tobacco Use   Smoking status: Every Day    Current packs/day: 0.50    Average packs/day: 0.5 packs/day for 2.0 years (1.0 ttl pk-yrs)    Types: Cigarettes   Smokeless tobacco: Never   Tobacco comments:    3 cig./day  Vaping Use   Vaping status: Never Used  Substance and Sexual Activity   Alcohol use: Yes    Comment: rarely   Drug use: No   Sexual activity: Not on file  Other Topics Concern   Not on file  Social History Narrative   Not on file   Social Determinants of Health   Financial Resource Strain: Not on file  Food Insecurity: Not on file  Transportation Needs: Not on file  Physical Activity: Not on file  Stress: Not on file  Social Connections: Unknown (09/30/2021)   Received from [facility], [facility]   Social Network    Social Network: Not on file  Intimate Partner Violence: Unknown (08/27/2021)   Received from [facility], [facility]   HITS    Physically Hurt: Not on file    Insult or Talk Down To: Not on file    Threaten Physical Harm: Not on file    Scream or Curse:  Not on file   ROS-see HPI   + = positive Constitutional:    weight loss, night sweats, fevers, chills, fatigue, lassitude. HEENT:    headaches, difficulty swallowing, tooth/dental problems, sore throat,       sneezing, itching, ear ache, nasal congestion, post nasal drip, snoring CV:    chest pain, orthopnea, PND, swelling in lower extremities, anasarca,                                   dizziness, palpitations Resp:   shortness of breath with exertion or at rest.                productive cough,   non-productive cough, coughing up of blood.              change in color of mucus.  wheezing.   Skin:    rash or lesions. GI:  No-   heartburn, indigestion, abdominal pain, nausea, vomiting, diarrhea,                 change in bowel habits, loss of appetite GU: dysuria,  change in color of urine, no urgency or frequency.   flank pain. MS:   joint pain, stiffness, decreased range of motion, back pain. Neuro-     nothing unusual Psych:  change in mood or affect.  depression or anxiety.   memory loss.  OBJ- Physical Exam General- Alert, Oriented, Affect-appropriate, Distress- none acute, +Muscular Skin- rash-none, lesions- none, excoriation- none Lymphadenopathy- none Head- atraumatic            Eyes- Gross vision intact, PERRLA, conjunctivae and secretions clear            Ears- Hearing, canals-normal            Nose- Clear, no-Septal dev, mucus, polyps, erosion, perforation             Throat- Mallampati III-IV , mucosa clear , drainage- none, tonsils+, +teeth Neck- flexible , trachea midline, no stridor , thyroid nl, carotid no bruit Chest - symmetrical excursion , unlabored           Heart/CV- RRR , no murmur , no gallop  , no rub, nl s1 s2                           - JVD- none , edema- none, stasis changes- none, varices- none           Lung- clear to P&A, wheeze- none, cough- none , dullness-none, rub- none           Chest wall-  Abd-  Br/ Gen/ Rectal- Not done, not indicated Extrem- cyanosis- none, clubbing, none, atrophy- none, strength- nl Neuro- grossly intact to observation

## 2022-12-06 ENCOUNTER — Ambulatory Visit (INDEPENDENT_AMBULATORY_CARE_PROVIDER_SITE_OTHER): Payer: Self-pay | Admitting: Internal Medicine

## 2022-12-06 ENCOUNTER — Encounter: Payer: Self-pay | Admitting: Internal Medicine

## 2022-12-06 VITALS — BP 122/84 | HR 90 | Ht 69.0 in | Wt 240.0 lb

## 2022-12-06 DIAGNOSIS — L03115 Cellulitis of right lower limb: Secondary | ICD-10-CM

## 2022-12-06 DIAGNOSIS — G4733 Obstructive sleep apnea (adult) (pediatric): Secondary | ICD-10-CM

## 2022-12-06 NOTE — Assessment & Plan Note (Signed)
Note hx of recurrent skin abscesses. Wonder if this is hidradenitis??

## 2022-12-06 NOTE — Patient Instructions (Signed)
Order- schedule home sleep test     dx OSA  Please call us about 2 weeks after your sleep test for results and recommendations 

## 2022-12-06 NOTE — Assessment & Plan Note (Signed)
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 Appropriate discussion done Plan- schedule sleep study. Likely candidate for CPAP

## 2022-12-16 ENCOUNTER — Ambulatory Visit: Payer: Self-pay | Admitting: Family Medicine

## 2022-12-23 ENCOUNTER — Telehealth: Payer: Self-pay | Admitting: Internal Medicine

## 2022-12-23 NOTE — Telephone Encounter (Signed)
This is waiting to be signed per Snap

## 2022-12-23 NOTE — Telephone Encounter (Signed)
Chuki calling on behalf of patient. States patient has been taken out of work until [facility] can deliver results of sleep study. States HST was done through [facility] at some point last week. PCCs, is this something yall can look into? If not, please route to triage to help. Thanks! Upping to urgent.

## 2022-12-24 NOTE — Telephone Encounter (Signed)
Spoke with patient regarding prior message.Advised patient that we are waiting for S[employer] to get the HST result's Patient also did contact snap yesterday and he was told 7-10 days to get result's. Patient stated he was taken out of work until the result's comes back .   Advised patient once we get the result's someone from our office will contact him with result's.  Patient's voice was understanding nothing else further needed.

## 2023-01-05 ENCOUNTER — Encounter (INDEPENDENT_AMBULATORY_CARE_PROVIDER_SITE_OTHER): Payer: Self-pay

## 2023-01-05 DIAGNOSIS — G4733 Obstructive sleep apnea (adult) (pediatric): Secondary | ICD-10-CM

## 2023-01-05 DIAGNOSIS — R0683 Snoring: Secondary | ICD-10-CM

## 2023-01-12 ENCOUNTER — Telehealth: Payer: Self-pay | Admitting: Internal Medicine

## 2023-01-12 NOTE — Telephone Encounter (Signed)
Spouse is calling again to see if our office receieved results from [facility]. He completed and mailed the study back towards the end of July. Chantel C has printed and uploaded the results to the chart. Please advise them of these results as patient has been taken out of work. Marking as urgent to ease tension.

## 2023-01-13 NOTE — Telephone Encounter (Signed)
Sleep study showed only 2.2 apneas/ hour, which is normal. He does not have sleep apnea.

## 2023-01-14 NOTE — Telephone Encounter (Signed)
Spoke with patient. Went over sleep study results. Patient has requested results to be mailed to his home to give to his boss. NFN

## 2023-02-14 ENCOUNTER — Emergency Department (HOSPITAL_COMMUNITY)
Admission: EM | Admit: 2023-02-14 | Discharge: 2023-02-14 | Disposition: A | Discharge status: Home / Self Care | Payer: Self-pay | Attending: Emergency Medicine | Admitting: Emergency Medicine

## 2023-02-14 ENCOUNTER — Encounter (HOSPITAL_COMMUNITY): Payer: Self-pay

## 2023-02-14 ENCOUNTER — Emergency Department (HOSPITAL_COMMUNITY): Payer: Self-pay

## 2023-02-14 ENCOUNTER — Other Ambulatory Visit: Payer: Self-pay

## 2023-02-14 ENCOUNTER — Encounter (HOSPITAL_COMMUNITY): Payer: Self-pay | Admitting: Emergency Medicine

## 2023-02-14 DIAGNOSIS — H538 Other visual disturbances: Secondary | ICD-10-CM | POA: Insufficient documentation

## 2023-02-14 DIAGNOSIS — R310 Gross hematuria: Secondary | ICD-10-CM | POA: Insufficient documentation

## 2023-02-14 DIAGNOSIS — Z20822 Contact with and (suspected) exposure to covid-19: Secondary | ICD-10-CM | POA: Insufficient documentation

## 2023-02-14 DIAGNOSIS — R519 Headache, unspecified: Secondary | ICD-10-CM | POA: Insufficient documentation

## 2023-02-14 DIAGNOSIS — R110 Nausea: Secondary | ICD-10-CM | POA: Insufficient documentation

## 2023-02-14 LAB — BASIC METABOLIC PANEL
Anion gap: 9 (ref 5–15)
BUN: 11 mg/dL (ref 6–20)
CO2: 26 mmol/L (ref 22–32)
Calcium: 8.9 mg/dL (ref 8.9–10.3)
Chloride: 104 mmol/L (ref 98–111)
Creatinine, Ser: 0.96 mg/dL (ref 0.61–1.24)
GFR, Estimated: >60 mL/min (ref >60)
Glucose, Bld: 130 mg/dL — ABNORMAL HIGH (ref 70–99)
Potassium: 4.3 mmol/L (ref 3.5–5.1)
Sodium: 139 mmol/L (ref 135–145)

## 2023-02-14 LAB — CBC
HCT: 45 % (ref 39.0–52.0)
Hemoglobin: 14.7 g/dL (ref 13.0–17.0)
MCH: 27.9 pg (ref 26.0–34.0)
MCHC: 32.7 g/dL (ref 30.0–36.0)
MCV: 85.6 fL (ref 80.0–100.0)
Platelets: 274 10*3/uL (ref 150–400)
RBC: 5.26 MIL/uL (ref 4.22–5.81)
RDW: 13 % (ref 11.5–15.5)
WBC: 8.2 10*3/uL (ref 4.0–10.5)
nRBC: 0 % (ref 0.0–0.2)

## 2023-02-14 LAB — URINALYSIS, ROUTINE W REFLEX MICROSCOPIC
Bacteria, UA: NONE SEEN
Bilirubin Urine: NEGATIVE
Glucose, UA: NEGATIVE mg/dL
Ketones, ur: NEGATIVE mg/dL
Leukocytes,Ua: NEGATIVE
Nitrite: NEGATIVE
Protein, ur: 30 mg/dL — AB
RBC / HPF: >50 RBC/hpf (ref 0–5)
Specific Gravity, Urine: 1.01 (ref 1.005–1.030)
WBC, UA: >50 WBC/hpf (ref 0–5)
pH: 6 (ref 5.0–8.0)

## 2023-02-14 LAB — SARS CORONAVIRUS 2 BY RT PCR: SARS Coronavirus 2 by RT PCR: NEGATIVE

## 2023-02-14 MED ORDER — METOCLOPRAMIDE HCL 5 MG/ML IJ SOLN
10.0000 mg | Freq: Once | INTRAMUSCULAR | Status: AC
  Administered 2023-02-14: 10 mg via INTRAVENOUS
  Filled 2023-02-14: qty 2

## 2023-02-14 MED ORDER — DIPHENHYDRAMINE HCL 50 MG/ML IJ SOLN
25.0000 mg | Freq: Once | INTRAMUSCULAR | Status: AC
  Administered 2023-02-14: 25 mg via INTRAVENOUS
  Filled 2023-02-14: qty 1

## 2023-02-14 MED ORDER — IOHEXOL 350 MG/ML SOLN
75.0000 mL | Freq: Once | INTRAVENOUS | Status: AC | PRN
  Administered 2023-02-14: 100 mL via INTRAVENOUS

## 2023-02-14 MED ORDER — SODIUM CHLORIDE 0.9 % IV BOLUS
1000.0000 mL | Freq: Once | INTRAVENOUS | Status: AC
  Administered 2023-02-14: 1000 mL via INTRAVENOUS

## 2023-02-14 NOTE — ED Provider Notes (Signed)
Received handoff from [facility], headachex6 days, on and off every few hours. C/o blood in urine as well. Pending CTA head/neck.  Physical Exam  BP (!) 131/101 (BP Location: Left Arm)   Pulse 70   Temp 98.4 F (36.9 C) (Oral)   Resp 16   Ht 5\' 9"  (1.753 m)   Wt 108.9 kg   SpO2 96%   BMI 35.44 kg/m   Physical Exam Vitals and nursing note reviewed.  Constitutional:      General: He is not in acute distress.    Appearance: He is well-developed.  HENT:     Head: Normocephalic and atraumatic.  Eyes:     Conjunctiva/sclera: Conjunctivae normal.  Cardiovascular:     Rate and Rhythm: Normal rate and regular rhythm.     Heart sounds: No murmur heard. Pulmonary:     Effort: Pulmonary effort is normal. No respiratory distress.     Breath sounds: Normal breath sounds.  Abdominal:     Palpations: Abdomen is soft.     Tenderness: There is no abdominal tenderness.  Musculoskeletal:        General: No swelling.     Cervical back: Neck supple.  Skin:    General: Skin is warm and dry.     Capillary Refill: Capillary refill takes less than 2 seconds.  Neurological:     General: No focal deficit present.     Mental Status: He is alert and oriented to person, place, and time.     Cranial Nerves: No cranial nerve deficit.     Sensory: No sensory deficit.     Motor: No weakness.     Coordination: Coordination normal.  Psychiatric:        Mood and Affect: Mood normal.     Procedures  Procedures  ED Course / MDM    Medical Decision Making I discussed with the patient, he has had bilateral squeezing sensation, headache, has been on and off for the last few days.  Wife at bedside says that he has been under more stress.  She he has not had any rhinorrhea or nasal drip.  Does have occasional blurring of vision, with this, and had some nausea.  No history of migraines.  I am suspicious for a migraine, versus tension headache.  Will trial headache cocktail and see if it improves his  headache.  He has no acute deficits, has not fallen, or hit his head.  Also states he has had blood in his urine, it appears to have gross blood in his urine.  No abdominal pain, or urinary symptoms however.  Amount and/or Complexity of Data Reviewed Labs: ordered.    Details: Gross hematuria, no electrolyte deficiencies Radiology: ordered.    Details: CTA head/neck, shows no acute findings.  CT abd/pelvis shows artifact in the left kidney Discussion of management or test interpretation with external provider(s): Discussed patient, CTA head/neck shows no acute finding, headache improved, with headache cocktail, believe this may have been secondary to a tension headache, versus migraine.  He is feeling much better, no neurodeficits noted.  Will have him follow-up with his PCP.  Additionally his gross hematuria I discussed this with the radiologist, CT abdomen pelvis, shows possible artifact, and left kidney, I discussed that there are be masses, and this and they stated likely not.  However the bladder is under distended, I sent him to urology for further evaluation as he smokes, and may have a mass present.  Otherwise it may be a large  stone that may have passed, however given the lack of symptoms this is unlikely.  He has no urinary symptoms, and does not have many bacteria in his urine, thus I think pyelonephritis or cystitis is unlikely.  I discussed this with his significant other and him at bedside, and they are agreeable to discharge, requesting discharge actually.  We discussed strict return precautions and they voiced understanding  Risk Prescription drug management.          Pete Pelt, [place] 02/14/23 2336    Gloris Manchester, MD 02/15/23 [phone]

## 2023-02-14 NOTE — ED Triage Notes (Signed)
Pt c/o neck pain, headaches, fatigue, nausea, weak on standing since Wednesday. This morning pt noted blood in urine.

## 2023-02-14 NOTE — Discharge Instructions (Signed)
Please follow-up with your primary care doctor, your head CT, was reassuring today.  There is no evidence of any kind of dissection, or aneurysm noted.  I am glad your headache is feeling better.  Additionally you are found to have gross hematuria, or blood in your urine today, the cause of this is unclear at this time, you need to follow-up with urology for further evaluation.

## 2023-02-14 NOTE — ED Provider Notes (Signed)
[place] EMERGENCY DEPARTMENT AT [facility] Provider Note   [name]: [id number] Arrival date & time: 02/14/23  1103     History  Chief Complaint  Patient presents with   Hematuria   Fatigue    Luis Pena is a 47 y.o. male with history of OSA presents with concern for a intermittent severe headache that has been ongoing for 6 days.  States the headache came on suddenly and will last about 5 minutes at a time and then go away. They return every couple of hours. Headache involves the whole head mostly behind the forehead, and he will also get some nausea during the episodes.  No history of migraine.  He has tried Advil, Excedrin, Tylenol at home without relief. Reports some blurred vision during these episodes.  Denies any fever or chills, neck stiffness. Denies any trauma such as MVC or fall.   Also reports blood in his urine that started this morning. No abdominal pain or vomiting.   Hematuria Associated symptoms include headaches.       Home Medications Prior to Admission medications   Not on File      Allergies    Shellfish allergy    Review of Systems   Review of Systems  Genitourinary:  Positive for hematuria.  Neurological:  Positive for headaches.    Physical Exam Updated Vital Signs BP 115/70 (BP Location: Right Arm)   Pulse 68   Temp 98.1 F (36.7 C) (Oral)   Resp 20   Ht 5\' 9"  (1.753 m)   Wt 108.9 kg   SpO2 98%   BMI 35.44 kg/m  Physical Exam Vitals and nursing note reviewed.  Constitutional:      General: He is not in acute distress.    Appearance: He is well-developed.  HENT:     Head: Normocephalic and atraumatic.  Eyes:     Conjunctiva/sclera: Conjunctivae normal.  Cardiovascular:     Rate and Rhythm: Normal rate and regular rhythm.     Heart sounds: No murmur heard. Pulmonary:     Effort: Pulmonary effort is normal. No respiratory distress.     Breath sounds: Normal breath sounds.  Abdominal:     Palpations: Abdomen  is soft.     Tenderness: There is no abdominal tenderness.  Musculoskeletal:        General: No swelling.     Cervical back: Neck supple.  Skin:    General: Skin is warm and dry.     Capillary Refill: Capillary refill takes less than 2 seconds.  Neurological:     Mental Status: He is alert.     Comments: Cranial nerves III through XII intact Intact sensation in the upper and lower extremities bilaterally 5/5 strength in the upper and lower extremities bilaterally  Psychiatric:        Mood and Affect: Mood normal.     ED Results / Procedures / Treatments   Labs (all labs ordered are listed, but only abnormal results are displayed) Labs Reviewed  URINALYSIS, ROUTINE W REFLEX MICROSCOPIC - Abnormal; Notable for the following components:      Result Value   APPearance HAZY (*)    Hgb urine dipstick MODERATE (*)    Protein, ur 30 (*)    All other components within normal limits  BASIC METABOLIC PANEL - Abnormal; Notable for the following components:   Glucose, Bld 130 (*)    All other components within normal limits  SARS CORONAVIRUS 2 BY RT PCR  CBC  GC/CHLAMYDIA PROBE AMP ([place]) NOT AT [facility]    EKG None  Radiology No results found.  Procedures Procedures    Medications Ordered in ED Medications  metoCLOPramide (REGLAN) injection 10 mg (has no administration in time range)  diphenhydrAMINE (BENADRYL) injection 25 mg (has no administration in time range)  sodium chloride 0.9 % bolus 1,000 mL (has no administration in time range)    ED Course/ Medical Decision Making/ A&P                                 Medical Decision Making Amount and/or Complexity of Data Reviewed Labs: ordered. Radiology: ordered.   47 y.o. male with pertinent past medical history of OSA presents to the ED for concern of intermittent severe headache for 6 days, uncontrolled with home medications  Differential diagnosis includes but is not limited to migraine, tension headache,  cluster headache, intracranial hemorrhage, nephrolithiasis   ED Course:  Patient with eyes closed and refused to get in bed upon initial evaluation.  Neurological exam without any cranial nerve deficits, 5/5 strength in the upper extremities, intact sensation in the upper and lower extremities bilaterally.  Given the report of severe and intermittent episodes of pain, concern for cluster headache.  However, since this is unusual for the patient, will obtain CTA head and neck to further evaluate and rule out any intracranial hemorrhage.  Patient started on 12L oxygen via non-re breather for treatment of possible cluster headache.  Holding on any pain medication until CTA head and neck results Patient also has gross hematuria that started today, but no abdominal pain. Will further evaluate with CT abdomen and pelvis to rule out nephrolithiasis   Impression: Headache Hematuria   Disposition:  Care of this patient signed out to oncoming ED provider Luis Alderman, PA-C to follow up on CT imaging. Disposition and treatment plan pending imaging results and clinical judgment of oncoming ED team.    Lab Tests: I Ordered, and personally interpreted labs.  The pertinent results include:   CBC without leukocytosis BMP without any elevation in creatinine or BUN Urinalysis with moderate hemoglobin, red blood cells  Imaging Studies ordered: I ordered imaging studies including CT head, CT abdomen pelvis ordered, waiting for results              Final Clinical Impression(s) / ED Diagnoses Final diagnoses:  Gross hematuria    Rx / DC Orders ED Discharge Orders     None         Luis Merles, PA-C 02/14/23 1536    Anders Simmonds T, DO 02/15/23 [phone]

## 2023-02-15 LAB — GC/CHLAMYDIA PROBE AMP (~~LOC~~) NOT AT ARMC
Chlamydia: NEGATIVE
Comment: NEGATIVE
Comment: NORMAL
Neisseria Gonorrhea: NEGATIVE

## 2023-03-11 ENCOUNTER — Emergency Department (HOSPITAL_COMMUNITY)
Admission: EM | Admit: 2023-03-11 | Discharge: 2023-03-12 | Discharge status: Against Medical Advice | Payer: No Typology Code available for payment source | Attending: Emergency Medicine | Admitting: Emergency Medicine

## 2023-03-11 ENCOUNTER — Other Ambulatory Visit: Payer: Self-pay

## 2023-03-11 DIAGNOSIS — R519 Headache, unspecified: Secondary | ICD-10-CM | POA: Diagnosis present

## 2023-03-11 DIAGNOSIS — Z5321 Procedure and treatment not carried out due to patient leaving prior to being seen by health care provider: Secondary | ICD-10-CM | POA: Diagnosis not present

## 2023-03-11 DIAGNOSIS — H538 Other visual disturbances: Secondary | ICD-10-CM | POA: Insufficient documentation

## 2023-03-11 NOTE — ED Triage Notes (Signed)
Patient seen by his eye MD last Wednesday advised him to go to ER for MRI due to persistent headache for 2 months with right eye blurred vision . Denies head or eye injury.

## 2023-03-12 NOTE — ED Notes (Signed)
Pt's family getting angry with staff stated that the Pt needs to be seen and they were here before others. This NT explained that the ED works by acuity not time. Pt seen leaving the ED with family.

## 2023-03-21 ENCOUNTER — Ambulatory Visit: Payer: No Typology Code available for payment source | Admitting: Neurology

## 2023-03-21 ENCOUNTER — Encounter: Payer: Self-pay | Admitting: Neurology

## 2023-03-21 VITALS — Ht 69.0 in | Wt 241.0 lb

## 2023-03-21 DIAGNOSIS — G932 Benign intracranial hypertension: Secondary | ICD-10-CM | POA: Diagnosis not present

## 2023-03-21 NOTE — Progress Notes (Signed)
[facility]  PATIENT: Luis Pena DOB: Nov 08, 1975  REQUESTING CLINICIAN: Olivia Canter, MD HISTORY FROM: Patient/Significant other  REASON FOR VISIT: Headahces, Papilledema found on Oph evaluation    HISTORICAL  CHIEF COMPLAINT:  Chief Complaint  Patient presents with   New Patient (Initial Visit)    Rm15, alone, papilledema, HA's: 3/30 days/ triggers: unidentifiable, eval for IIH: pt stated that he gets up and feels dizzy then legs feel noodly like they are giving out ORTHOSTATIC BP completed    HISTORY OF PRESENT ILLNESS:  This is a 47 year old gentleman past medical history obesity who is presenting with her significant other with complaints of headaches.  Patient reports since the beginning of this year, he has been having headaches, on average 3-4 times a week.  Headaches usually last about 5 to 30 minutes but at time he will have severe headache that he will have to hold on to his head, sometimes it can cause him to feel wobbly, almost like he is unable to walk.  Headaches are bifrontal, describing as aching pain.  He has not tried any prescribed medications for the headaches.  He did have an ophthalmology evaluation which found bilateral papilledema and patient was referred here.  He also complained of visual obscuration with his headaches, nausea, no vomiting and there is also photophobia and phonophobia.  He reports when the headaches are severe he just want to be left alone.  Denies any triggers for her headaches.   OTHER MEDICAL CONDITIONS: Obesity   REVIEW OF SYSTEMS: Full 14 system review of systems performed and negative with exception of: As noted in the HPI   ALLERGIES: Allergies  Allergen Reactions   Shellfish Allergy Shortness Of Breath, Swelling and Anaphylaxis   Shrimp Extract Anaphylaxis    HOME MEDICATIONS: No outpatient medications prior to visit.   No facility-administered medications prior to visit.    PAST  MEDICAL HISTORY: Past Medical History:  Diagnosis Date   Abscess of tendon sheath of left lower leg 05/25/2015   Foreign body of left lower leg 05/25/2015   Foreign body of leg, left, superficial, infected 06/06/2015   History of MRSA infection 05/24/2014   scrotum   Skin cancer     PAST SURGICAL HISTORY: Past Surgical History:  Procedure Laterality Date   FOREIGN BODY REMOVAL Left 06/06/2015   Procedure: REMOVAL FOREIGN BODY ;  Surgeon: Teryl Lucy, MD;  Location: [facility];  Service: Orthopedics;  Laterality: Left;   HAND HARDWARE REMOVAL Right    INCISION AND DRAINAGE ABSCESS Left 06/06/2015   Procedure: LEFT LOWER LEG INCISION AND DRAINAGE ;  Surgeon: Teryl Lucy, MD;  Location:  SURGERY CENTER;  Service: Orthopedics;  Laterality: Left;   OPEN REDUCTION INTERNAL FIXATION (ORIF) HAND Right 2010    FAMILY HISTORY: Family History  Problem Relation Age of Onset   Diabetes Mother    Cancer Father     SOCIAL HISTORY: Social History   Socioeconomic History   Marital status: Married    Spouse name: shuki   Number of children: 3   Years of education: Not on file   Highest education level: Bachelor's degree (e.g., BA, AB, BS)  Occupational History   Not on file  Tobacco Use   Smoking status: Every Day    Current packs/day: 0.50    Average packs/day: 0.5 packs/day for 2.0 years (1.0 ttl pk-yrs)    Types: Cigarettes   Smokeless tobacco: Never   Tobacco comments:  3 cig./day  Vaping Use   Vaping status: Never Used  Substance and Sexual Activity   Alcohol use: Yes    Alcohol/week: 1.0 standard drink of alcohol    Types: 1 Standard drinks or equivalent per week    Comment: rarely   Drug use: No   Sexual activity: Yes    Birth control/protection: None  Other Topics Concern   Not on file  Social History Narrative   Not on file   Social Determinants of Health   Financial Resource Strain: Not on file  Food Insecurity: Not on file   Transportation Needs: Not on file  Physical Activity: Not on file  Stress: Not on file  Social Connections: Unknown (09/30/2021)   [facility] from [facility], [facility]   Social Network    Social Network: Not on file  Intimate Partner Violence: Unknown (08/27/2021)   Received from [facility], [facility]   HITS    Physically Hurt: Not on file    Insult or Talk Down To: Not on file    Threaten Physical Harm: Not on file    Scream or Curse: Not on file    PHYSICAL EXAM  GENERAL EXAM/CONSTITUTIONAL: Vitals:  Vitals:   03/21/23 1515 03/21/23 1517  SpO2: 95% 93%  Weight: 241 lb (109.3 kg)   Height: 5\' 9"  (1.753 m)    Body mass index is 35.59 kg/m. Wt Readings from Last 3 Encounters:  03/21/23 241 lb (109.3 kg)  02/14/23 240 lb (108.9 kg)  12/06/22 240 lb (108.9 kg)   Patient is in no distress; well developed, nourished and groomed; neck is supple  MUSCULOSKELETAL: Gait, strength, tone, movements noted in Neurologic exam below  NEUROLOGIC: MENTAL STATUS:      No data to display         awake, alert, oriented to person, place and time recent and remote memory intact normal attention and concentration language fluent, comprehension intact, naming intact fund of knowledge appropriate  CRANIAL NERVE:  2nd, 3rd, 4th, 6th - Visual fields full to confrontation, extraocular muscles intact, no nystagmus 5th - facial sensation symmetric 7th - facial strength symmetric 8th - hearing intact 9th - palate elevates symmetrically, uvula midline 11th - shoulder shrug symmetric 12th - tongue protrusion midline  MOTOR:  normal bulk and tone, full strength in the BUE, BLE  SENSORY:  normal and symmetric to light touch  COORDINATION:  finger-nose-finger, fine finger movements normal  GAIT/STATION:  normal   DIAGNOSTIC DATA (LABS, IMAGING, TESTING) - I reviewed patient records, labs, notes, testing and imaging myself where available.  Lab Results  Component  Value Date   WBC 8.2 02/14/2023   HGB 14.7 02/14/2023   HCT 45.0 02/14/2023   MCV 85.6 02/14/2023   PLT 274 02/14/2023      Component Value Date/Time   NA 139 02/14/2023 1144   K 4.3 02/14/2023 1144   CL 104 02/14/2023 1144   CO2 26 02/14/2023 1144   GLUCOSE 130 (H) 02/14/2023 1144   BUN 11 02/14/2023 1144   CREATININE 0.96 02/14/2023 1144   CALCIUM 8.9 02/14/2023 1144   GFRNONAA >60 02/14/2023 1144   GFRAA >60 02/04/2017 0332   No results found for: "CHOL", "HDL", "LDLCALC", "LDLDIRECT", "TRIG", "CHOLHDL" No results found for: "HGBA1C" No results found for: "VITAMINB12" No results found for: "TSH"  Head CT 10/11/2018 Normal head CT.   Head CT and CTA head 02/14/2023 1. No acute intracranial abnormality. 2. Normal CTA of the head.  ASSESSMENT AND  PLAN  47 y.o. year old male with history of obesity who is presenting with complaint of headaches associated with blurry vision, visual obscuration, found to have bilateral papilledema concerning for idiopathic intracranial hypertension.  His head CT did not show any mass.  Plan will be to obtain a MRI brain to look for evidence of IIH and also obtain a lumbar puncture for opening pressure.  I will contact the patient to go over the results, and if positive will start him on medication.  If negative, will treat these headaches as migraines vs. tension headaches.  Follow-up in 6 to 8 months or sooner if worse.   1. IIH (idiopathic intracranial hypertension)      Patient Instructions  MRI brain to look for evidence of idiopathic intracranial hypertension Lumbar puncture for opening pressure I will contact you to go over the result, if positive will discuss starting medication Continue to follow with PCP Return in 6 to 8 months.  Orders Placed This Encounter  Procedures   MR BRAIN WO CONTRAST   DG FL GUIDED LUMBAR PUNCTURE    No orders of the defined types were placed in this encounter.   Return in about 6 months (around  09/19/2023).    Windell Norfolk, MD 03/21/2023, 5:31 PM  [facility] [address] [place], [place] [address] [phone]

## 2023-03-21 NOTE — Patient Instructions (Signed)
MRI brain to look for evidence of idiopathic intracranial hypertension Lumbar puncture for opening pressure I will contact you to go over the result, if positive will discuss starting medication Continue to follow with PCP Return in 6 to 8 months.

## 2023-04-06 ENCOUNTER — Encounter: Payer: Self-pay | Admitting: Neurology

## 2023-04-15 ENCOUNTER — Ambulatory Visit
Admission: RE | Admit: 2023-04-15 | Discharge: 2023-04-15 | Disposition: A | Discharge status: Home / Self Care | Payer: PRIVATE HEALTH INSURANCE | Source: Ambulatory Visit | Attending: Neurology | Admitting: Neurology

## 2023-04-15 DIAGNOSIS — G932 Benign intracranial hypertension: Secondary | ICD-10-CM

## 2023-05-04 ENCOUNTER — Telehealth: Payer: Self-pay | Admitting: Neurology

## 2023-05-04 NOTE — Telephone Encounter (Signed)
Pt's wife requesting medication for claustrophobia for MRI. Advised that an MRI would need to be scheduled first before medication can be sent. She will call to r/s MRI first

## 2023-05-04 NOTE — Telephone Encounter (Signed)
Wife called back stating the MRI has been scheduled for 05/28/2023. Would like prescription sent to [facility] [phone]

## 2023-05-28 ENCOUNTER — Ambulatory Visit: Admission: RE | Admit: 2023-05-28 | Payer: No Typology Code available for payment source | Source: Ambulatory Visit

## 2023-05-30 NOTE — Addendum Note (Signed)
 Addended by: Lenn Cal on: 05/30/2023 10:34 AM   Modules accepted: Orders

## 2023-05-30 NOTE — Telephone Encounter (Signed)
 Spoke with patients wife. She stated that she called in asking for medication to be sent in for the patient to have his MRI. It doesn't look like this medication was ever sent it. She states he attempted to have the MRI completed but could not do it. She is going to call GI to have this rescheduled and is asking that medication for claustrophobia be sent to [facility] [phone]    She is going to call back with the new MRI appt date and time.   FYI - it doesn't look like the message from 12/11 was ever routed to anyone to address.

## 2023-05-31 MED ORDER — ALPRAZOLAM 1 MG PO TABS: 1.0000 mg | ORAL_TABLET | Freq: Once | ORAL | 0 refills | Status: DC | PRN

## 2023-05-31 NOTE — Addendum Note (Signed)
 Addended byWindell Norfolk on: 05/31/2023 07:31 AM   Modules accepted: Orders

## 2023-05-31 NOTE — Telephone Encounter (Signed)
 Done. Thanks.

## 2023-06-06 ENCOUNTER — Encounter: Payer: Self-pay | Admitting: Neurology

## 2023-06-12 ENCOUNTER — Ambulatory Visit
Admission: RE | Admit: 2023-06-12 | Discharge: 2023-06-12 | Disposition: A | Discharge status: Home / Self Care | Payer: No Typology Code available for payment source | Source: Ambulatory Visit | Attending: Neurology | Admitting: Neurology

## 2023-06-12 DIAGNOSIS — G932 Benign intracranial hypertension: Secondary | ICD-10-CM | POA: Diagnosis not present

## 2023-06-14 ENCOUNTER — Encounter: Payer: Self-pay | Admitting: Neurology

## 2023-06-29 NOTE — Discharge Instructions (Signed)

## 2023-06-30 ENCOUNTER — Ambulatory Visit
Admission: RE | Admit: 2023-06-30 | Discharge: 2023-06-30 | Disposition: A | Discharge status: Home / Self Care | Payer: PRIVATE HEALTH INSURANCE | Source: Ambulatory Visit | Attending: Neurology | Admitting: Neurology

## 2023-06-30 DIAGNOSIS — G932 Benign intracranial hypertension: Secondary | ICD-10-CM

## 2023-07-01 ENCOUNTER — Other Ambulatory Visit: Payer: PRIVATE HEALTH INSURANCE

## 2023-07-03 ENCOUNTER — Other Ambulatory Visit: Payer: Self-pay | Admitting: Neurology

## 2023-07-03 MED ORDER — ACETAZOLAMIDE 250 MG PO TABS: 250.0000 mg | ORAL_TABLET | Freq: Two times a day (BID) | ORAL | 6 refills | Status: DC

## 2023-07-03 NOTE — Progress Notes (Signed)
 Please call and advise the patient that the recent lumbar puncture showed an elevated pressure at 29 consistent with Idiopathic intracranial hypertension. Advise patient that I am going to start him on a medication called Acetazolamide  to take twice daily. This medicine will lower the intracranial pressure but it can also cause numbness and tingling in hands.  Please remind patient to keep any upcoming appointments or tests and to call us  with any interim questions, concerns, problems or updates. Thanks,   Pastor Falling, MD

## 2023-07-04 ENCOUNTER — Telehealth: Payer: Self-pay

## 2023-07-04 NOTE — Telephone Encounter (Signed)
 Unable to leave msg.

## 2023-07-04 NOTE — Telephone Encounter (Signed)
-----   Message from [facility] sent at 07/03/2023  9:29 AM EST ----- Please call and advise the patient that the recent lumbar puncture showed an elevated pressure at 29 consistent with Idiopathic intracranial hypertension. Advise patient that I am going to start him on a medication called Acetazolamide  to take twice daily. This medicine will lower the intracranial pressure but it can also cause numbness and tingling in hands.  Please remind patient to keep any upcoming appointments or tests and to call us  with any interim questions, concerns, problems or updates. Thanks,   Cassandra Cleveland, MD

## 2023-07-05 ENCOUNTER — Other Ambulatory Visit: Payer: Self-pay | Admitting: Neurology

## 2023-07-05 LAB — CSF CELL COUNT WITH DIFFERENTIAL
RBC Count, CSF: 7 {cells}/uL — ABNORMAL HIGH
TOTAL NUCLEATED CELL: 0 {cells}/uL (ref 0–5)

## 2023-07-05 LAB — PROTEIN, CSF: Total Protein, CSF: 42 mg/dL (ref 15–45)

## 2023-07-05 LAB — GLUCOSE, CSF: Glucose, CSF: 95 mg/dL — ABNORMAL HIGH (ref 40–80)

## 2023-07-11 NOTE — Progress Notes (Signed)
 Please have the patient start the diamox first, and if he is having uncontrollable side effects, then we will switch the medication.

## 2023-07-11 NOTE — Progress Notes (Signed)
 Medication sent to [facility] [phone]

## 2023-07-11 NOTE — Progress Notes (Signed)
 Until his headaches get better. Currently if he is not having headaches, he can delay starting the medication.

## 2023-08-28 ENCOUNTER — Encounter (HOSPITAL_COMMUNITY): Payer: Self-pay

## 2023-08-28 ENCOUNTER — Other Ambulatory Visit: Payer: Self-pay

## 2023-08-28 ENCOUNTER — Emergency Department (HOSPITAL_COMMUNITY)

## 2023-08-28 ENCOUNTER — Emergency Department (HOSPITAL_COMMUNITY)
Admission: EM | Admit: 2023-08-28 | Discharge: 2023-08-28 | Disposition: A | Discharge status: Home / Self Care | Attending: Emergency Medicine | Admitting: Emergency Medicine

## 2023-08-28 DIAGNOSIS — R319 Hematuria, unspecified: Secondary | ICD-10-CM | POA: Insufficient documentation

## 2023-08-28 DIAGNOSIS — F17210 Nicotine dependence, cigarettes, uncomplicated: Secondary | ICD-10-CM | POA: Insufficient documentation

## 2023-08-28 LAB — TYPE AND SCREEN
ABO/RH(D): B POS
Antibody Screen: NEGATIVE

## 2023-08-28 LAB — CBC WITH DIFFERENTIAL/PLATELET
Abs Immature Granulocytes: 0.01 10*3/uL (ref 0.00–0.07)
Basophils Absolute: 0.1 10*3/uL (ref 0.0–0.1)
Basophils Relative: 1 %
Eosinophils Absolute: 0.3 10*3/uL (ref 0.0–0.5)
Eosinophils Relative: 4 %
HCT: 45.1 % (ref 39.0–52.0)
Hemoglobin: 15.1 g/dL (ref 13.0–17.0)
Immature Granulocytes: 0 %
Lymphocytes Relative: 41 %
Lymphs Abs: 3 10*3/uL (ref 0.7–4.0)
MCH: 28.8 pg (ref 26.0–34.0)
MCHC: 33.5 g/dL (ref 30.0–36.0)
MCV: 86.1 fL (ref 80.0–100.0)
Monocytes Absolute: 0.5 10*3/uL (ref 0.1–1.0)
Monocytes Relative: 6 %
Neutro Abs: 3.5 10*3/uL (ref 1.7–7.7)
Neutrophils Relative %: 48 %
Platelets: 277 10*3/uL (ref 150–400)
RBC: 5.24 MIL/uL (ref 4.22–5.81)
RDW: 12.9 % (ref 11.5–15.5)
WBC: 7.2 10*3/uL (ref 4.0–10.5)
nRBC: 0 % (ref 0.0–0.2)

## 2023-08-28 LAB — ABO/RH: ABO/RH(D): B POS

## 2023-08-28 LAB — URINALYSIS, ROUTINE W REFLEX MICROSCOPIC
Bacteria, UA: NONE SEEN
Bilirubin Urine: NEGATIVE
Glucose, UA: NEGATIVE mg/dL
Ketones, ur: NEGATIVE mg/dL
Leukocytes,Ua: NEGATIVE
Nitrite: NEGATIVE
Protein, ur: NEGATIVE mg/dL
Specific Gravity, Urine: 1.014 (ref 1.005–1.030)
pH: 5 (ref 5.0–8.0)

## 2023-08-28 LAB — COMPREHENSIVE METABOLIC PANEL WITH GFR
ALT: 21 U/L (ref 0–44)
AST: 20 U/L (ref 15–41)
Albumin: 4.2 g/dL (ref 3.5–5.0)
Alkaline Phosphatase: 66 U/L (ref 38–126)
Anion gap: 8 (ref 5–15)
BUN: 13 mg/dL (ref 6–20)
CO2: 24 mmol/L (ref 22–32)
Calcium: 9.4 mg/dL (ref 8.9–10.3)
Chloride: 106 mmol/L (ref 98–111)
Creatinine, Ser: 0.93 mg/dL (ref 0.61–1.24)
GFR, Estimated: >60 mL/min (ref >60)
Glucose, Bld: 117 mg/dL — ABNORMAL HIGH (ref 70–99)
Potassium: 3.8 mmol/L (ref 3.5–5.1)
Sodium: 138 mmol/L (ref 135–145)
Total Bilirubin: 0.5 mg/dL (ref 0.0–1.2)
Total Protein: 7.8 g/dL (ref 6.5–8.1)

## 2023-08-28 LAB — APTT: aPTT: 27 s (ref 24–36)

## 2023-08-28 LAB — PROTIME-INR
INR: 1 (ref 0.8–1.2)
Prothrombin Time: 12.9 s (ref 11.4–15.2)

## 2023-08-28 NOTE — ED Triage Notes (Signed)
 Patient began urinating blood with clots since Wednesday. Complaining of lower back pain. Denies burning urination.

## 2023-08-28 NOTE — Discharge Instructions (Addendum)
 You were seen in the ER today for evaluation of the blood in your urine. It is extremely important that you follow up with a Urologist. I have included the information for one in this discharge paperwork. Pleas call to schedule an appointment. If you start to have a fever, belly/flank pain, difficulty urinating, passing clots, or unable to urinate, please return to the ER for re-evaluation. If you have any other concerns, new or worsening symptoms, please return to the nearest ER for re-evaluation.   Contact a health care provider if: Your symptoms don't get better after 3 days. Your symptoms get worse. You have back pain or belly pain. You have a fever or chills. You throw up or feel like you may throw up. You throw up every time you take medicine. Get help right away if: You pass blood clots in your pee. You pass out. These symptoms may be an emergency. Call 911 right away. Do not wait to see if the symptoms will go away. Do not drive yourself to the hospital.

## 2023-08-28 NOTE — ED Provider Notes (Signed)
 [place] [facility]GENCY DEPARTMENT AT [facility] Provider Note   [name]: [id number] Arrival date & time: 08/28/23  0865     History Chief Complaint  Patient presents with   Hematuria    Luis Pena is a 48 y.o. male reportedly otherwise healthy presents emerged from today for evaluation of hematuria since Wednesday.  Patient denies any trauma.  He went to urgent care Friday and was told to come to the ER however he did not.  He has been seen here previously for hematuria however did not follow-up with urologist.  He denies any dysuria.  Has been present every time he urinates.  He has no issue starting a stream and feels that he adequately voids afterwards.  Reports occasional very small flecks of clot. He denies any urgency or frequency.  Denies any fever.  Denies any belly pain or flank pain.  He denies any swelling to his penis, testicles, or scrotum.  Denies any penile discharge.  No nausea or vomiting.  He is a pack per day smoker for the past 25 years.  No known drug allergies.   Hematuria Pertinent negatives include no abdominal pain.       Home Medications Prior to Admission medications   Medication Sig Start Date End Date Taking? Authorizing Provider  acetaZOLAMIDE (DIAMOX) 250 MG tablet Take 1 tablet (250 mg total) by mouth 2 (two) times daily. 07/03/23 01/29/24  Windell Norfolk, MD  ALPRAZolam Prudy Feeler) 1 MG tablet Take 1 tablet (1 mg total) by mouth once as needed for up to 1 dose for anxiety (take 1 tablet 1 hour prior to MRI, may repeat at appt time if needed). 05/31/23   Windell Norfolk, MD      Allergies    Shellfish allergy and Shrimp extract    Review of Systems   Review of Systems  Constitutional:  Negative for chills and fever.  Gastrointestinal:  Negative for abdominal pain, constipation, diarrhea, nausea and vomiting.  Genitourinary:  Positive for hematuria. Negative for decreased urine volume, difficulty urinating, dysuria, flank pain, frequency, penile  discharge, penile pain, penile swelling, scrotal swelling, testicular pain and urgency.  Musculoskeletal:  Negative for back pain.    Physical Exam Updated Vital Signs BP 133/63   Pulse 72   Temp 98.2 F (36.8 C) (Oral)   Resp 16   Ht 5\' 9"  (1.753 m)   Wt 106.6 kg   SpO2 97%   BMI 34.70 kg/m  Physical Exam Vitals and nursing note reviewed.  Constitutional:      General: He is not in acute distress.    Appearance: He is not ill-appearing or toxic-appearing.  HENT:     Mouth/Throat:     Mouth: Mucous membranes are moist.  Eyes:     General: No scleral icterus. Cardiovascular:     Rate and Rhythm: Normal rate.  Pulmonary:     Effort: Pulmonary effort is normal. No respiratory distress.  Abdominal:     Palpations: Abdomen is soft.     Tenderness: There is no abdominal tenderness. There is no right CVA tenderness, left CVA tenderness, guarding or rebound.  Skin:    General: Skin is warm and dry.  Neurological:     Mental Status: He is alert.     ED Results / Procedures / Treatments   Labs (all labs ordered are listed, but only abnormal results are displayed) Labs Reviewed  URINALYSIS, ROUTINE W REFLEX MICROSCOPIC - Abnormal; Notable for the following components:  Result Value   Hgb urine dipstick LARGE (*)    All other components within normal limits  COMPREHENSIVE METABOLIC PANEL WITH GFR - Abnormal; Notable for the following components:   Glucose, Bld 117 (*)    All other components within normal limits  URINE CULTURE  PROTIME-INR  APTT  CBC WITH DIFFERENTIAL/PLATELET  TYPE AND SCREEN  ABO/RH    EKG None  Radiology CT Renal Stone Study Result Date: 08/28/2023 CLINICAL DATA:  Hematuria.  Back pain EXAM: CT ABDOMEN AND PELVIS WITHOUT CONTRAST TECHNIQUE: Multidetector CT imaging of the abdomen and pelvis was performed following the standard protocol without IV contrast. RADIATION DOSE REDUCTION: This exam was performed according to the departmental  dose-optimization program which includes automated exposure control, adjustment of the mA and/or kV according to patient size and/or use of iterative reconstruction technique. COMPARISON:  CT 02/14/2023. FINDINGS: Lower chest: Breathing motion seen at the lung bases. There is some hazy ground-glass along the lung bases, nonspecific. No pleural effusion. Hepatobiliary: No focal liver abnormality is seen. No gallstones, gallbladder wall thickening, or biliary dilatation. Pancreas: Unremarkable. No pancreatic ductal dilatation or surrounding inflammatory changes. Spleen: Normal in size without focal abnormality. Adrenals/Urinary Tract: Adrenal glands are unremarkable. Kidneys are normal, without renal calculi, focal lesion, or hydronephrosis. Bladder is unremarkable. Stomach/Bowel: Stomach is within normal limits. Appendix appears normal. No evidence of bowel wall thickening, distention, or inflammatory changes. Vascular/Lymphatic: Normal caliber aorta and IVC. Minimal calcified plaque along the iliac vessels. No specific abnormal lymph node enlargement identified in the abdomen and pelvis. Reproductive: Prostate is unremarkable. Other: No free air or free fluid. 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 IMPRESSION: No obstructing renal stone. No collecting system dilatation. Overall if there is further concern of the patient's history of hematuria postcontrast imaging could be considered when appropriate with protocol for the genitourinary system. No bowel obstruction.  Scattered stool.  Normal appendix. Electronically Signed   By: Karen Kays M.D.   On: 08/28/2023 11:45    Procedures Procedures   Medications Ordered in ED Medications - No data to display  ED Course/ Medical Decision Making/ A&P    Medical Decision Making Amount and/or Complexity of Data Reviewed Labs: ordered. Radiology: ordered.   48 y.o. male presents to the ER for evaluation of painless hematuria. Differential  diagnosis includes but is not limited to Cystitis, urinary calculi, renal cell carcinoma, bladder cancer, glomerulonephritis, polycystic kidneys, anticoagulant usage, interstitial nephritis, BPH, prostate cancer. Vital signs mildly elevated BP at 136/94, otherwise unremarkable. Physical exam as noted above.   On previous chart evaluation, patient was seen on 02-14-2023 for gross materia.  Was encouraged to follow-up with urology however was lost to follow-up.  I independently reviewed and interpreted the patient's labs.  Urinalysis shows large hemoglobin but no red blood cells present.  Upon viewing of the urine, it is yellow in appearance now it is no longer bloody.  The patient did have pictures of his urine for the past few days and did look like gross hematuria.  CMP shows glucose at 117 otherwise no other electrolyte or LFT abnormality.  CBC without leukocytosis or anemia.  aPTT and PT/INR within normal limits.  Urine culture pending given gross hematuria.  CT imaging shows No obstructing renal stone. No collecting system dilatation. Overall if there is further concern of the patient's history of hematuria postcontrast imaging could be considered when appropriate with protocol for the genitourinary system. No bowel obstruction.  Scattered stool.  Normal appendix. Per  radiologist's interpretation.    I discussed at length with patient and family member at bedside the importance of following up with urology given that this is the second occurrence as well as his smoking history makes him more susceptible for things such as malignancy.  He and his wife assured me that they will follow-up with urology.  Information for urologist given the discharge paperwork.  He is not having any urinary symptoms with the hematuria and it is now since involved.  PVR was 0.  He is not retaining any urine.  I have added on a culture to see if this has any occult infection.  We discussed the results of the labs/imaging. The  plan is follow-up with urology. We discussed strict return precautions and red flag symptoms. The patient verbalized their understanding and agrees to the plan. The patient is stable and being discharged home in good condition.  Portions of this report may have been transcribed using voice recognition software. Every effort was made to ensure accuracy; however, inadvertent computerized transcription errors may be present.    Final Clinical Impression(s) / ED Diagnoses Final diagnoses:  Hematuria, unspecified type    Rx / DC Orders ED Discharge Orders     None         Achille Rich, PA-C 08/28/23 1737    Pricilla Loveless, MD 08/30/23 [phone]

## 2023-08-29 LAB — URINE CULTURE: Culture: NO GROWTH

## 2023-09-15 ENCOUNTER — Ambulatory Visit (INDEPENDENT_AMBULATORY_CARE_PROVIDER_SITE_OTHER): Payer: PRIVATE HEALTH INSURANCE | Admitting: Family Medicine

## 2023-09-15 ENCOUNTER — Encounter: Payer: Self-pay | Admitting: Family Medicine

## 2023-09-15 VITALS — BP 120/82 | HR 90 | Temp 97.8°F | Ht 69.0 in | Wt 239.0 lb

## 2023-09-15 DIAGNOSIS — R319 Hematuria, unspecified: Secondary | ICD-10-CM | POA: Diagnosis not present

## 2023-09-15 DIAGNOSIS — F17200 Nicotine dependence, unspecified, uncomplicated: Secondary | ICD-10-CM

## 2023-09-15 DIAGNOSIS — E669 Obesity, unspecified: Secondary | ICD-10-CM

## 2023-09-15 DIAGNOSIS — G932 Benign intracranial hypertension: Secondary | ICD-10-CM | POA: Diagnosis not present

## 2023-09-15 DIAGNOSIS — Z860100 Personal history of colon polyps, unspecified: Secondary | ICD-10-CM

## 2023-09-15 NOTE — Progress Notes (Signed)
 New Patient Office Visit  Subjective    Patient ID: Luis Pena, male    DOB: Jul 14, 1975  Age: 48 y.o. 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   Other providers: Neurologist for idiopathic intracranial hypertension.  [facility] -hx of skin cancer on scalp   Seeing a urologist for hematuria.   Does not like to take medications.   Reports he does not have sleep apnea- sleep study done.    Married, 1 child  Smokes cigarettes 1/2 ppd for 15 years   Colonoscopy done at Dr. Nickey Barn  Polyps     Outpatient Encounter Medications as of 09/15/2023  Medication Sig   [DISCONTINUED] acetaZOLAMIDE  (DIAMOX ) 250 MG tablet Take 1 tablet (250 mg total) by mouth 2 (two) times daily.   [DISCONTINUED] ALPRAZolam  (XANAX ) 1 MG tablet Take 1 tablet (1 mg total) by mouth once as needed for up to 1 dose for anxiety (take 1 tablet 1 hour prior to MRI, may repeat at appt time if needed).   No facility-administered encounter medications on file as of 09/15/2023.    Past Medical History:  Diagnosis Date   Abscess of tendon sheath of left lower leg 05/25/2015   Foreign body of left lower leg 05/25/2015   Foreign body of leg, left, superficial, infected 06/06/2015   Ganglion cyst of wrist, left 06/06/2020   Ganglion, right wrist 06/06/2020   History of MRSA infection 05/24/2014   scrotum   Skin cancer     Past Surgical History:  Procedure Laterality Date   FOREIGN BODY REMOVAL Left 06/06/2015   Procedure: REMOVAL FOREIGN BODY ;  Surgeon: Osa Blase, MD;  Location: [facility];  Service: Orthopedics;  Laterality: Left;   HAND HARDWARE REMOVAL Right    INCISION AND DRAINAGE ABSCESS Left 06/06/2015   Procedure: LEFT LOWER LEG INCISION AND DRAINAGE ;  Surgeon: Osa Blase, MD;  Location: [facility];   Service: Orthopedics;  Laterality: Left;   OPEN REDUCTION INTERNAL FIXATION (ORIF) HAND Right 2010    Family History  Problem Relation Age of Onset   Diabetes Mother    Cancer Father     Social History   Socioeconomic History   Marital status: Married    Spouse name: shuki   Number of children: 3   Years of education: Not on file   Highest education level: Bachelor's degree (e.g., BA, AB, BS)  Occupational History   Not on file  Tobacco Use   Smoking status: Every Day    Current packs/day: 0.50    Average packs/day: 0.5 packs/day for 2.0 years (1.0 ttl pk-yrs)    Types: Cigarettes   Smokeless tobacco: Never   Tobacco comments:    3 cig./day  Vaping Use   Vaping status: Never Used  Substance and Sexual Activity   Alcohol use: Yes    Alcohol/week: 1.0 standard drink of alcohol    Types: 1 Standard drinks or equivalent per week    Comment: rarely   Drug use: No   Sexual activity: Yes    Birth control/protection: None  Other Topics Concern   Not on file  Social History Narrative   Not on file   Social Drivers of Health   Financial Resource Strain: Not on file  Food Insecurity: Not on file  Transportation Needs:  Not on file  Physical Activity: Not on file  Stress: Not on file  Social Connections: Unknown (09/30/2021)   Received from [facility]   Social Network    Social Network: Not on file  Intimate Partner Violence: Unknown (08/27/2021)   Received from [facility], [facility]   HITS    Physically Hurt: Not on file    Insult or Talk Down To: Not on file    Threaten Physical Harm: Not on file    Scream or Curse: Not on file    Review of Systems  Constitutional:  Negative for chills, fever, malaise/fatigue and weight loss.  Eyes:  Negative for blurred vision and double vision.  Respiratory:  Negative for cough and shortness of breath.   Cardiovascular:  Negative for chest pain, palpitations and leg swelling.  Gastrointestinal:   Negative for abdominal pain, constipation, diarrhea, nausea and vomiting.  Genitourinary:  Negative for dysuria, frequency and urgency.  Neurological:  Negative for dizziness, tingling, focal weakness and headaches.  Psychiatric/Behavioral:  Negative for depression. The patient is not nervous/anxious.         Objective    BP 120/82 (BP Location: Left Arm, Patient Position: Sitting)   Pulse 90   Temp 97.8 F (36.6 C) (Temporal)   Ht 5\' 9"  (1.753 m)   Wt 239 lb (108.4 kg)   SpO2 96%   BMI 35.29 kg/m   Physical Exam Constitutional:      General: He is not in acute distress.    Appearance: He is not ill-appearing.  Eyes:     Extraocular Movements: Extraocular movements intact.     Conjunctiva/sclera: Conjunctivae normal.  Cardiovascular:     Rate and Rhythm: Normal rate and regular rhythm.  Pulmonary:     Effort: Pulmonary effort is normal.     Breath sounds: Normal breath sounds.  Musculoskeletal:     Cervical back: Normal range of motion and neck supple.     Right lower leg: No edema.     Left lower leg: No edema.  Skin:    General: Skin is warm and dry.  Neurological:     General: No focal deficit present.     Mental Status: He is alert and oriented to person, place, and time.     Motor: No weakness.     Coordination: Coordination normal.     Gait: Gait normal.  Psychiatric:        Mood and Affect: Mood normal.        Behavior: Behavior normal.        Thought Content: Thought content normal.         Assessment & Plan:   Problem List Items Addressed This Visit   None Visit Diagnoses       Obesity (BMI 30-39.9)    -  Primary     Hematuria, unspecified type         Idiopathic intracranial hypertension         History of colonic polyps         Smoker          He is a pleasant 48 year old male who is new to the practice and here to establish care. Encouraged smoking cessation. Upcoming appointment/procedure with urology for hematuria. 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  He is not taking recommended medication. Follow up for fasting CPE in 3 months   Return in about 3 months (around 12/15/2023) for fasting CPE.   Alyson Back, NP-C

## 2023-09-28 ENCOUNTER — Ambulatory Visit: Payer: No Typology Code available for payment source | Admitting: Neurology

## 2023-10-27 ENCOUNTER — Ambulatory Visit: Admitting: Neurology

## 2023-10-27 ENCOUNTER — Telehealth: Payer: Self-pay | Admitting: Neurology

## 2023-10-27 NOTE — Telephone Encounter (Signed)
 Lisa/phone room cx pt appt.

## 2023-10-27 NOTE — Telephone Encounter (Signed)
 request to cancel appointment, conflict w/ work

## 2024-05-10 ENCOUNTER — Encounter: Payer: Self-pay | Admitting: Family Medicine

## 2024-05-10 ENCOUNTER — Ambulatory Visit: Admitting: Family Medicine

## 2024-05-10 VITALS — BP 124/86 | HR 98 | Temp 97.9°F | Ht 69.0 in | Wt 246.0 lb

## 2024-05-10 DIAGNOSIS — B351 Tinea unguium: Secondary | ICD-10-CM

## 2024-05-10 DIAGNOSIS — E669 Obesity, unspecified: Secondary | ICD-10-CM | POA: Diagnosis not present

## 2024-05-10 DIAGNOSIS — F17200 Nicotine dependence, unspecified, uncomplicated: Secondary | ICD-10-CM | POA: Diagnosis not present

## 2024-05-10 DIAGNOSIS — Z0001 Encounter for general adult medical examination with abnormal findings: Secondary | ICD-10-CM

## 2024-05-10 DIAGNOSIS — R319 Hematuria, unspecified: Secondary | ICD-10-CM | POA: Diagnosis not present

## 2024-05-10 DIAGNOSIS — Z0000 Encounter for general adult medical examination without abnormal findings: Secondary | ICD-10-CM

## 2024-05-10 DIAGNOSIS — Z1159 Encounter for screening for other viral diseases: Secondary | ICD-10-CM

## 2024-05-10 DIAGNOSIS — Z13220 Encounter for screening for lipoid disorders: Secondary | ICD-10-CM

## 2024-05-10 NOTE — Patient Instructions (Signed)
 Please call [facility]   You will hear from [facility] to schedule   Preventive Care 72-46 Years Old, Male Preventive care refers to lifestyle choices and visits with your health care provider that can promote health and wellness. Preventive care visits are also called wellness exams. What can I expect for my preventive care visit? Counseling During your preventive care visit, your health care provider may ask about your: Medical history, including: Past medical problems. Family medical history. Current health, including: Emotional well-being. Home life and relationship well-being. Sexual activity. Lifestyle, including: Alcohol, nicotine or tobacco, and drug use. Access to firearms. Diet, exercise, and sleep habits. Safety issues such as seatbelt and bike helmet use. Sunscreen use. Work and work astronomer. Physical exam Your health care provider will check your: Height and weight. These may be used to calculate your BMI (body mass index). BMI is a measurement that tells if you are at a healthy weight. Waist circumference. This measures the distance around your waistline. This measurement also tells if you are at a healthy weight and may help predict your risk of certain diseases, such as type 2 diabetes and high blood pressure. Heart rate and blood pressure. Body temperature. Skin for abnormal spots. What immunizations do I need?  Vaccines are usually given at various ages, according to a schedule. Your health care provider will recommend vaccines for you based on your age, medical history, and lifestyle or other factors, such as travel or where you work. What tests do I need? Screening Your health care provider may recommend screening tests for certain conditions. This may include: Lipid and cholesterol levels. Diabetes screening. This is done by checking your blood sugar (glucose) after you have not eaten for a while (fasting). Hepatitis B test. Hepatitis C test. HIV  (human immunodeficiency virus) test. STI (sexually transmitted infection) testing, if you are at risk. Lung cancer screening. Prostate cancer screening. Colorectal cancer screening. Talk with your health care provider about your test results, treatment options, and if necessary, the need for more tests. Follow these instructions at home: Eating and drinking  Eat a diet that includes fresh fruits and vegetables, whole grains, lean protein, and low-fat dairy products. Take vitamin and mineral supplements as recommended by your health care provider. Do not drink alcohol if your health care provider tells you not to drink. If you drink alcohol: Limit how much you have to 0-2 drinks a day. Know how much alcohol is in your drink. In the [place] one drink equals one 12 oz bottle of beer (355 mL), one 5 oz glass of wine (148 mL), or one 1 oz glass of hard liquor (44 mL). Lifestyle Brush your teeth every morning and night with fluoride toothpaste. Floss one time each day. Exercise for at least 30 minutes 5 or more days each week. Do not use any products that contain nicotine or tobacco. These products include cigarettes, chewing tobacco, and vaping devices, such as e-cigarettes. If you need help quitting, ask your health care provider. Do not use drugs. If you are sexually active, practice safe sex. Use a condom or other form of protection to prevent STIs. Take aspirin only as told by your health care provider. Make sure that you understand how much to take and what form to take. Work with your health care provider to find out whether it is safe and beneficial for you to take aspirin daily. Find healthy ways to manage stress, such as: Meditation, yoga, or listening to music. Journaling. Talking to  a trusted person. Spending time with friends and family. Minimize exposure to UV radiation to reduce your risk of skin cancer. Safety Always wear your seat belt while driving or riding in a vehicle. Do  not drive: If you have been drinking alcohol. Do not ride with someone who has been drinking. When you are tired or distracted. While texting. If you have been using any mind-altering substances or drugs. Wear a helmet and other protective equipment during sports activities. If you have firearms in your house, make sure you follow all gun safety procedures. What's next? Go to your health care provider once a year for an annual wellness visit. Ask your health care provider how often you should have your eyes and teeth checked. Stay up to date on all vaccines. This information is not intended to replace advice given to you by your health care provider. Make sure you discuss any questions you have with your health care provider. Document Revised: 11/05/2020 Document Reviewed: 11/05/2020 Elsevier Patient Education  2024 [employer].

## 2024-05-10 NOTE — Progress Notes (Signed)
 Subjective:     Patient ID: Luis Pena, male    DOB: Dec 02, 1975, 48 y.o.   MRN: [id number]  Chief Complaint  Patient presents with   Follow-up    Referral for podiatry, toe fungus. Genital check, wants to know sperm cell count.  Check on blood flow    HPI  Discussed the use of AI scribe software for clinical note transcription with the patient, who gave verbal consent to proceed.  History of Present Illness Luis Pena is a 48 year old male who presents with concerns of onychomycosis and a history of hematuria who is also here for CPE.   Onychomycosis - Chronic fungal infection of toenails on both feet - No improvement with various over-the-counter treatments - Professional pedicures provide only nail clipping and scraping, with no resolution of infection - Difficulty ambulating on certain surfaces due to affected toenails  Hematuria - Single episode of gross hematuria in April - Emergency room evaluation at the time of episode - Subsequent urology evaluation included cystoscopy and prostate evaluation. Information not in EMR.  - All urologic tests reported as normal - Uncertain of all details regarding the evaluation     Health Maintenance Due  Topic Date Due   DTaP/Tdap/Td (5 - Tdap) 10/03/1988   HIV Screening  Never done   Hepatitis C Screening  Never done   Pneumococcal Vaccine (1 of 2 - PCV) Never done   Hepatitis B Vaccines 19-59 Average Risk (1 of 3 - 19+ 3-dose series) Never done   Colonoscopy  Never done    Past Medical History:  Diagnosis Date   Abscess of tendon sheath of left lower leg 05/25/2015   Foreign body of left lower leg 05/25/2015   Foreign body of leg, left, superficial, infected 06/06/2015   Ganglion cyst of wrist, left 06/06/2020   Ganglion, right wrist 06/06/2020   History of MRSA infection 05/24/2014   scrotum   Skin cancer     Past Surgical History:  Procedure Laterality Date   FOREIGN BODY REMOVAL Left 06/06/2015    Procedure: REMOVAL FOREIGN BODY ;  Surgeon: Fonda Olmsted, MD;  Location: [facility];  Service: Orthopedics;  Laterality: Left;   HAND HARDWARE REMOVAL Right    INCISION AND DRAINAGE ABSCESS Left 06/06/2015   Procedure: LEFT LOWER LEG INCISION AND DRAINAGE ;  Surgeon: Fonda Olmsted, MD;  Location: [facility];  Service: Orthopedics;  Laterality: Left;   OPEN REDUCTION INTERNAL FIXATION (ORIF) HAND Right 2010    Family History  Problem Relation Age of Onset   Diabetes Mother    Cancer Father     Social History   Socioeconomic History   Marital status: Married    Spouse name: shuki   Number of children: 3   Years of education: Not on file   Highest education level: Bachelor's degree (e.g., BA, AB, BS)  Occupational History   Not on file  Tobacco Use   Smoking status: Every Day    Current packs/day: 0.50    Average packs/day: 0.5 packs/day for 2.0 years (1.0 ttl pk-yrs)    Types: Cigarettes   Smokeless tobacco: Never   Tobacco comments:    3 cig./day  Vaping Use   Vaping status: Never Used  Substance and Sexual Activity   Alcohol use: Yes    Alcohol/week: 1.0 standard drink of alcohol    Types: 1 Standard drinks or equivalent per week    Comment: rarely   Drug use: No  Sexual activity: Yes    Birth control/protection: None  Other Topics Concern   Not on file  Social History Narrative   Not on file   Social Drivers of Health   Tobacco Use: High Risk (05/10/2024)   Patient History    Smoking Tobacco Use: Every Day    Smokeless Tobacco Use: Never    Passive Exposure: Not on file  Financial Resource Strain: Not on file  Food Insecurity: Not on file  Transportation Needs: Not on file  Physical Activity: Not on file  Stress: Not on file  Social Connections: Unknown (09/30/2021)   [facility]   Social Network    Social Network: Not on file  Intimate Partner Violence: Unknown (08/27/2021)   Received from [facility]    HITS    Physically Hurt: Not on file    Insult or Talk Down To: Not on file    Threaten Physical Harm: Not on file    Scream or Curse: Not on file  Depression (PHQ2-9): Low Risk (05/10/2024)   Depression (PHQ2-9)    PHQ-2 Score: 0  Alcohol Screen: Not on file  Housing: Not on file  Utilities: Not on file  Health Literacy: Not on file    No outpatient medications prior to visit.   No facility-administered medications prior to visit.    Allergies[1]  Review of Systems  Constitutional:  Negative for chills, fever, malaise/fatigue and weight loss.  HENT:  Negative for congestion, ear pain, sinus pain and sore throat.   Eyes:  Negative for blurred vision, double vision and pain.  Respiratory:  Negative for cough, shortness of breath and wheezing.   Cardiovascular:  Negative for chest pain, palpitations and leg swelling.  Gastrointestinal:  Negative for abdominal pain, constipation, diarrhea, nausea and vomiting.  Genitourinary:  Negative for dysuria, frequency, hematuria and urgency.  Musculoskeletal:  Negative for back pain, joint pain and myalgias.  Skin:  Negative for rash.  Neurological:  Negative for dizziness, tingling, focal weakness and headaches.  Psychiatric/Behavioral:  Negative for depression. The patient is not nervous/anxious.        Objective:    Physical Exam Constitutional:      General: He is not in acute distress.    Appearance: He is not ill-appearing.  HENT:     Right Ear: Ear canal and external ear normal.     Left Ear: Ear canal and external ear normal.     Nose: Nose normal.     Mouth/Throat:     Mouth: Mucous membranes are moist.     Pharynx: Oropharynx is clear.  Eyes:     Extraocular Movements: Extraocular movements intact.     Conjunctiva/sclera: Conjunctivae normal.     Pupils: Pupils are equal, round, and reactive to light.  Neck:     Thyroid: No thyroid mass, thyromegaly or thyroid tenderness.  Cardiovascular:     Rate and Rhythm:  Normal rate and regular rhythm.     Pulses: Normal pulses.     Heart sounds: Normal heart sounds.  Pulmonary:     Effort: Pulmonary effort is normal.     Breath sounds: Normal breath sounds.  Abdominal:     General: There is no distension.     Palpations: Abdomen is soft.     Tenderness: There is no abdominal tenderness.     Hernia: There is no hernia in the left inguinal area or right inguinal area.  Genitourinary:    Epididymis:     Right: Normal.  Left: Normal.  Musculoskeletal:        General: Normal range of motion.     Cervical back: Normal range of motion and neck supple. No tenderness.     Right lower leg: No edema.     Left lower leg: No edema.  Lymphadenopathy:     Cervical: No cervical adenopathy.     Lower Body: No right inguinal adenopathy. No left inguinal adenopathy.  Skin:    General: Skin is warm and dry.     Comments: Toenails severely deformed with thickening and yellowish nails  Neurological:     General: No focal deficit present.     Mental Status: He is alert and oriented to person, place, and time.     Cranial Nerves: No cranial nerve deficit.     Sensory: No sensory deficit.     Motor: No weakness.  Psychiatric:        Mood and Affect: Mood normal.        Behavior: Behavior normal.        Thought Content: Thought content normal.      BP 124/86   Pulse 98   Temp 97.9 F (36.6 C) (Temporal)   Ht 5' 9 (1.753 m)   Wt 246 lb (111.6 kg)   SpO2 96%   BMI 36.33 kg/m  Wt Readings from Last 3 Encounters:  05/10/24 246 lb (111.6 kg)  09/15/23 239 lb (108.4 kg)  08/28/23 235 lb (106.6 kg)       Assessment & Plan:   Problem List Items Addressed This Visit   None Visit Diagnoses       Encounter for general adult medical examination with abnormal findings    -  Primary     Hematuria, unspecified type       Relevant Orders   Urinalysis, Routine w reflex microscopic     Obesity (BMI 30-39.9)       Relevant Orders   Comprehensive metabolic  panel with GFR   CBC with Differential/Platelet   Hemoglobin A1c   Lipid panel   TSH   T4, free     Smoker         Onychomycosis       Relevant Orders   Ambulatory referral to Podiatry     Encounter for screening for other viral diseases       Relevant Orders   Hepatitis C antibody   HIV Antibody (routine testing w rflx)       Assessment and Plan Assessment & Plan Onychomycosis Chronic toenail fungus affecting both feet, causing difficulty walking. Over-the-counter treatments have been ineffective, indicating a more severe infection requiring specialist intervention. - Refer to podiatry for evaluation and management of onychomycosis. [facility] will contact him to schedule an appointment.  Hematuria Persistent hematuria with a significant episode in April. Urological evaluation, including prostate, penile, and testicular examinations, were normal per patient. Further urological evaluation and sperm count testing are advised as he requests.  - Instruct him to contact the urology office to schedule an appointment for further evaluation, including sperm count testing if desired.  Routine Health Maintenance Weight increased by seven pounds since the last visit. Blood pressure and heart rate are within normal limits. Tetanus vaccination and flu shot are overdue. HIV and hepatitis C testing are also overdue. - Preventive health care reviewed.  Counseling on healthy lifestyle including diet and exercise.  Recommend regular dental and eye exams.  Immunizations reviewed.   - Order blood work including cholesterol,  A1c, blood counts, and electrolytes. - Perform HIV and hepatitis C testing. - Administer tetanus vaccine if available.   Obesity - check labs to look for underlying complications related to obesity  - work on [employer] and exercise     [employer]. [facility] does not currently have medications on file.  No orders of the defined types were placed in this  encounter.      [1]  Allergies Allergen Reactions   Shellfish Allergy Shortness Of Breath, Swelling and Anaphylaxis   Shrimp Extract Anaphylaxis

## 2024-05-11 ENCOUNTER — Ambulatory Visit: Payer: Self-pay | Admitting: Family Medicine

## 2024-05-11 DIAGNOSIS — E119 Type 2 diabetes mellitus without complications: Secondary | ICD-10-CM

## 2024-05-11 LAB — LIPID PANEL
Cholesterol: 190 mg/dL (ref 28–200)
HDL: 33.2 mg/dL — ABNORMAL LOW
LDL Cholesterol: 124 mg/dL — ABNORMAL HIGH (ref 10–99)
NonHDL: 156.85
Total CHOL/HDL Ratio: 6
Triglycerides: 163 mg/dL — ABNORMAL HIGH (ref 10.0–149.0)
VLDL: 32.6 mg/dL (ref 0.0–40.0)

## 2024-05-11 LAB — HEMOGLOBIN A1C: Hgb A1c MFr Bld: 8.4 % — ABNORMAL HIGH (ref 4.6–6.5)

## 2024-05-11 LAB — COMPREHENSIVE METABOLIC PANEL WITH GFR
ALT: 15 U/L (ref 3–53)
AST: 15 U/L (ref 5–37)
Albumin: 4.4 g/dL (ref 3.5–5.2)
Alkaline Phosphatase: 72 U/L (ref 39–117)
BUN: 12 mg/dL (ref 6–23)
CO2: 27 meq/L (ref 19–32)
Calcium: 9.2 mg/dL (ref 8.4–10.5)
Chloride: 102 meq/L (ref 96–112)
Creatinine, Ser: 1.06 mg/dL (ref 0.40–1.50)
GFR: 84.11 mL/min
Glucose, Bld: 195 mg/dL — ABNORMAL HIGH (ref 70–99)
Potassium: 4 meq/L (ref 3.5–5.1)
Sodium: 137 meq/L (ref 135–145)
Total Bilirubin: 0.3 mg/dL (ref 0.2–1.2)
Total Protein: 7.3 g/dL (ref 6.0–8.3)

## 2024-05-11 LAB — URINALYSIS, ROUTINE W REFLEX MICROSCOPIC
Bilirubin Urine: NEGATIVE
Hgb urine dipstick: NEGATIVE
Ketones, ur: NEGATIVE
Leukocytes,Ua: NEGATIVE
Nitrite: NEGATIVE
RBC / HPF: NONE SEEN
Specific Gravity, Urine: 1.02 (ref 1.000–1.030)
Total Protein, Urine: NEGATIVE
Urine Glucose: 250 — AB
Urobilinogen, UA: 1 (ref 0.0–1.0)
pH: 5.5 (ref 5.0–8.0)

## 2024-05-11 LAB — CBC WITH DIFFERENTIAL/PLATELET
Basophils Absolute: 0.1 K/uL (ref 0.0–0.1)
Basophils Relative: 1.3 % (ref 0.0–3.0)
Eosinophils Absolute: 0.3 K/uL (ref 0.0–0.7)
Eosinophils Relative: 3.9 % (ref 0.0–5.0)
HCT: 41.7 % (ref 39.0–52.0)
Hemoglobin: 14 g/dL (ref 13.0–17.0)
Lymphocytes Relative: 46.5 % — ABNORMAL HIGH (ref 12.0–46.0)
Lymphs Abs: 3.8 K/uL (ref 0.7–4.0)
MCHC: 33.5 g/dL (ref 30.0–36.0)
MCV: 85.7 fl (ref 78.0–100.0)
Monocytes Absolute: 0.6 K/uL (ref 0.1–1.0)
Monocytes Relative: 6.9 % (ref 3.0–12.0)
Neutro Abs: 3.4 K/uL (ref 1.4–7.7)
Neutrophils Relative %: 41.4 % — ABNORMAL LOW (ref 43.0–77.0)
Platelets: 271 K/uL (ref 150.0–400.0)
RBC: 4.87 Mil/uL (ref 4.22–5.81)
RDW: 12.8 % (ref 11.5–15.5)
WBC: 8.2 K/uL (ref 4.0–10.5)

## 2024-05-11 LAB — T4, FREE: Free T4: 0.73 ng/dL (ref 0.60–1.60)

## 2024-05-11 LAB — TSH: TSH: 3.46 u[IU]/mL (ref 0.35–5.50)

## 2024-05-11 MED ORDER — METFORMIN HCL ER 500 MG PO TB24: 500.0000 mg | ORAL_TABLET | Freq: Every day | ORAL | 0 refills | Status: AC

## 2024-05-11 NOTE — Progress Notes (Signed)
 Please schedule him to see me next week. He has diabetes. His A1c is 8.4% He should start metformin with breakfast. He also needs to reduce sugar and carbohydrates in his diet. Potatoes, bread, pasta, and rice. Avoid soda and sugary beverages. We will further discuss this next week.

## 2024-05-12 LAB — HIV ANTIBODY (ROUTINE TESTING W REFLEX)
HIV 1&2 Ab, 4th Generation: NONREACTIVE
HIV FINAL INTERPRETATION: NEGATIVE

## 2024-05-12 LAB — HEPATITIS C ANTIBODY: Hepatitis C Ab: NONREACTIVE

## 2024-06-01 ENCOUNTER — Ambulatory Visit

## 2024-06-01 ENCOUNTER — Ambulatory Visit: Admitting: Family Medicine

## 2024-06-01 ENCOUNTER — Ambulatory Visit: Admitting: Podiatry

## 2024-06-01 DIAGNOSIS — M79674 Pain in right toe(s): Secondary | ICD-10-CM

## 2024-06-01 DIAGNOSIS — M79675 Pain in left toe(s): Secondary | ICD-10-CM

## 2024-06-01 DIAGNOSIS — E119 Type 2 diabetes mellitus without complications: Secondary | ICD-10-CM

## 2024-06-01 DIAGNOSIS — B351 Tinea unguium: Secondary | ICD-10-CM

## 2024-06-01 MED ORDER — TERBINAFINE HCL 250 MG PO TABS: 250.0000 mg | ORAL_TABLET | Freq: Every day | ORAL | 0 refills | Status: AC

## 2024-06-01 NOTE — Progress Notes (Signed)
"  °  Subjective:  Patient ID: Luis Pena, male    DOB: 13-Oct-1975,  MRN: [id number]  49 y.o. male presents with chief concern of diabetes with elongated, thickened, painful, discolored toenails for months. Aggravating factor(s) include movement and palpation. Patient has tried self attempt at trimming toenail. He relates to a recent diagnosis of diabetes. He was referred here for a diabetic foot exam and toenail maintenance. He denies subjective symptoms of neuropathy.   Chief Complaint  Patient presents with   Nail Problem    Rm10 Patient complains of thick and discolored toenails bilateral/ diabetic A1c8.4      PCP is Henson, Vickie L, NP-C   Allergies[1]  Review of Systems: Negative except as noted in the HPI.   Objective:  Luis Pena is a pleasant 49 y.o. male in NAD. AAO x 3.  Vascular Examination: Vascular status intact b/l with palpable pedal pulses. CFT immediate b/l. Pedal hair present. No edema. No pain with calf compression b/l. Skin temperature gradient WNL b/l. No varicosities noted. No cyanosis or clubbing noted.  Neurological Examination: Sensation grossly intact b/l with 10 gram monofilament. Negative tinel sign at tarsal tunnel bilaterally.   Dermatological Examination: Pedal skin with normal turgor, texture and tone b/l. No open wounds nor interdigital macerations noted. Toenails 1-5 b/l thick, discolored, elongated with subungual debris and pain on dorsal palpation. No hyperkeratotic lesions noted b/l.   Musculoskeletal Examination: Muscle strength 5/5 to b/l LE.  No pain, crepitus noted b/l. No gross pedal deformities. Patient ambulates independently without assistive aids.   Radiographs: None Last A1c:      Latest Ref Rng & Units 05/10/2024    4:29 PM  Hemoglobin A1C  Hemoglobin-A1c 4.6 - 6.5 % 8.4      Assessment:   1. Onychomycosis   2. Diabetes mellitus without complication (HCC)     Plan:  Diabetic foot examination performed  today. All patient's and/or POA's questions/concerns addressed on today's visit. Toenails 1-5 b/l debrided in length and girth without incident. Continue foot and shoe inspections daily. Monitor blood glucose per PCP/Endocrinologist's recommendations. Continue soft, supportive shoe gear daily. Report any pedal injuries to medical professional. Call office if there are any questions/concerns. -Patient/POA to call should there be question/concern in the interim.  RTC 3 months  Prentice Ovens, DPM AACFAS [facility] Trained Podiatric Surgeon [facility]       [place] LOCATION: [address] [place] [address]                   Office [phone]   [place] LOCATION: [address] [place], [place] [address] Office [phone]     [1]  Allergies Allergen Reactions   Shellfish Allergy Shortness Of Breath, Swelling and Anaphylaxis   Shrimp Extract Anaphylaxis   "

## 2024-06-15 ENCOUNTER — Encounter: Payer: Self-pay | Admitting: Family Medicine

## 2024-06-15 ENCOUNTER — Ambulatory Visit: Admitting: Family Medicine

## 2024-06-25 NOTE — Telephone Encounter (Signed)
 In office needed?
